# Patient Record
Sex: Male | Born: 1935 | Race: White | Hispanic: No
Health system: Midwestern US, Academic
[De-identification: ages and names within clinical notes are randomized; demographics above are authoritative.]

---

## 2016-12-11 ENCOUNTER — Ambulatory Visit: Admit: 2016-12-11 | Discharge: 2016-12-12 | Payer: MEDICARE

## 2016-12-11 ENCOUNTER — Encounter: Admit: 2016-12-11 | Discharge: 2016-12-11 | Payer: MEDICARE

## 2016-12-11 DIAGNOSIS — E7800 Pure hypercholesterolemia, unspecified: ICD-10-CM

## 2016-12-11 DIAGNOSIS — E669 Obesity, unspecified: ICD-10-CM

## 2016-12-11 DIAGNOSIS — I4821 Permanent atrial fibrillation: Principal | ICD-10-CM

## 2016-12-11 DIAGNOSIS — I2510 Atherosclerotic heart disease of native coronary artery without angina pectoris: ICD-10-CM

## 2016-12-11 DIAGNOSIS — I10 Essential (primary) hypertension: ICD-10-CM

## 2016-12-11 DIAGNOSIS — M1990 Unspecified osteoarthritis, unspecified site: ICD-10-CM

## 2016-12-11 DIAGNOSIS — M069 Rheumatoid arthritis, unspecified: ICD-10-CM

## 2016-12-11 DIAGNOSIS — E785 Hyperlipidemia, unspecified: ICD-10-CM

## 2016-12-11 NOTE — Progress Notes
Date of Service: 12/11/2016    Fernando [name] is a 82 y.o. male.       HPI     Mr. Fernando [name]???has been followed in the past for coronary artery disease,???having undergone coronary bypass surgery in 2007. ???He also has been followed for hypertension and for hypercholesterolemia. ??? On May 24, 2016 during a routine visit, a routine ECG revealed atrial fibrillation. At that time he reported no palpitations or sensation of sustained forceful heart pounding.  However,  His CHA2DS2-VASc score is 3 for age and hypertension. ???The risks and benefits of anticoagulation therapy were reviewed with the patient. Different anticoagulant medications were reviewed with the patient in detail.  He is very polite but is adamant that he will not take anticoagulant medications.  I believe that he is concerned about the bleeding risk involved. The patient???reports that he continues to feel well and has no complaints. ???Mr. Fernando [name] reports that he is active with daily chores and tries to walk several days a week for exercise. The patient reports no angina, congestive symptoms, palpitations, sensation of sustained forceful heart pounding, lightheadedness or syncope. ???His exercise tolerance has been stable. The patient reports no myalgias, bleeding abnormalities, neurologic motor abnormalities or difficulty with speech. Mr. Fernando [name]???reports no recent emergency room visits or hospitalizations.       Vitals:    12/11/16 0837 12/11/16 0845   BP: 126/78 130/70   Pulse: 62    Weight: 112 kg (247 lb)    Height: 1.88 m (6' 2)      Body mass index is 31.71 kg/m???.     Past Medical History  Patient Active Problem List    Diagnosis Date Noted   ??? Osteoarthrosis, unspecified whether generalized or localized, lower leg 03/11/2012   ??? HTN (hypertension) 03/11/2012   ??? Primary localized osteoarthrosis, lower leg 12/18/2011   ??? Pain in joint, lower leg 12/18/2011   ??? S/P knee replacement 12/18/2011   ??? Knee pain 10/25/2011   ??? Osteoarthritis of knee 10/25/2011 ??? 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 CAD (coronary artery disease) 11/11/2008     status post CABG on 06/18/2005 (patient received a LIMA to      LAD, reverse SVG to OM branch, reverse SVG to mid-RCA and reverse SVG to second      diagonal).  Patient is angina free and he is a functional Class I.  History of chest pain and admission in [facility] in 11/2007.  Exercise myocardial perfusion imaging study dated 11/13/2007, unremarkable for      ischemia.     ??? Obesity 11/11/2008   ??? Hyperlipidemia 11/11/2008   ??? Hypertension, essential 11/11/2008         Review of Systems   Constitution: Negative.   HENT: Negative.    Eyes: Negative.    Cardiovascular: Negative.    Respiratory: Negative.    Endocrine: Negative.    Hematologic/Lymphatic: Negative.    Skin: Negative.    Musculoskeletal: Negative.    Gastrointestinal: Negative.    Genitourinary: Negative.    Neurological: Negative.    Psychiatric/Behavioral: Negative.    Allergic/Immunologic: Negative.        Physical Exam  GENERAL: The patient is well developed, well nourished, resting comfortably and in no distress.   HEENT: No abnormalities of the visible oro-nasopharynx, conjunctiva or sclera are noted.  NECK: There is no jugular venous distension. Carotids are palpable and without bruits. There is no thyroid enlargement.  Chest: Lung fields are  clear to auscultation. There are no wheezes or crackles.  CV: There is a regular rhythm. The first and second heart sounds are normal. ???Grade 2/6 to 3/6 systolic ejection murmur is heard near the right upper sternal border. ???There are no diastolic murmurs, gallops or rubs.  His apical heart rate is 72 bpm.  ABD: The abdomen is soft and supple with normal bowel sounds. There is no hepatosplenomegaly, ascites, tenderness, masses or bruits.  Neuro: There are no focal motor defects. Ambulation is normal. Cognitive function appears normal.  Ext:???There is trace bipedal edema without ???evidence of deep vein thrombosis. Peripheral pulses are satisfactory. ???  SKIN:???There are no rashes and no cellulitis  PSYCH:???The patient is calm, rationale and oriented.    Cardiovascular Studies  A twelve-lead ECG was obtained on 05/24/2016 reveals atrial fibrillation with a heart rate of 76 bpm. ???Mild nonspecific ST-T wave abnormalities are noted but there is no evidence of myocardial ischemia or myocardial infarction. ???A prior ECG obtained on 03/04/2014 revealed normal sinus rhythm with a heart rate of 58 bpm.  ???  Echo Doppler study 05/30/2016:  ??? Technically difficult study; i.v. transpulmonary contrast was used to define the endocardial borders.  ??? The underlying rhythm is atrial fibrillation.  ??? Normal left ventricular systolic function, estimated ejection fraction is 60%.  ??? Severe predominantly mid septal hypertrophy.  ??? Probably severe diastolic dysfunction, elevated filling pressure.  ??? Right Ventricle: Mildly dilated. TAPSE ranged from 1.5 cm to 1.6 cm (reference range 1.5 - 2.0 cm). Mildly reduced ejection fraction.  ??? Bi atrial diltatation.  ??? Mild mitral and tricuspid valve regurgitation, moderate to severe pulmonic valve regurgitation.  ??? Severely calcified aortic valve, mild stenosis (MG= 12 mmHG, AVA= 1.10 cm???, peak velocity = 2.4 m/sec), no regurgitation.  ??? Estimated Peak Systolic PA Pressure = 43 mmHg    Regadenoson thallium stress test 06/20/2016:  Scintigraphic (planar/tomographic): There are no perfusion defects.  All myocardial segments appear viable. Polar coordinate map identifies no perfusion abnormalities.???  Regional Wall Thickening and Motion Post Stress: There is abnormal septal motion with retained thickening consistent with the patient's history of prior cardiac surgery. Otherwise all other segments demonstrate normal left ventricular wall motion and thickening of all myocardial segments. Left Ventricular Ejection Fraction (post stress, in the resting state) = 60 %. Left Ventricular End Diastolic Volume: 87 mL.???  SUMMARY/OPINION:  This study is normal with no evidence of significant myocardial ischemia. Left ventricular systolic function is normal, the ejection fraction was 60%. There are no high risk prognostic indicators present.  The pulmonary to myocardial count ratio is borderline at 0.51.  There is no transient ischemic dilatation noted.  The ECG portion of the study is negative for ischemia.???  There was a prior study obtained in our office on 03/06/2012.  The images were not able to be restored for direct comparison.  However, the report indicated the ejection fraction is 59%, the end-diastolic volume was 120 mL, the pulmonary marker to account ratio 0.40, there is no transient ischemic dilatation.  The study also demonstrated no significant perfusion abnormalities. Overall it is not appear to be a significant change since the prior study.  ???  Problems Addressed Today  Permanent atrial fibrillation.  Coronary artery disease.  Hypertension.  Hypercholesterolemia.  Assessment and Plan     Mr. Sheeley's ECG from 05/24/2016 revealed atrial fibrillation and his heart rate is still irregular today.???His heart rate is well controlled. His CHA2DS2-VASc score is 3 for age and hypertension. ???The  risks and benefits of anticoagulation therapy were once again extensively reviewed with the patient in lay terms. The patient understands that anticoagulation is used to decrease thrombotic or clotting complications associated with atrial fibrillation/flutter, such as stroke and systemic embolization which can be disabling or fatal, but can, on occasion, lead to life-threatening bleeding complications including gastrointestinal or intracranial hemorrhage. Anticoagulation options were presented to the patient which included warfarin or one of the newer direct acting oral anticoagulants.  He is very polite, however,  Mr. Herberger, once again, is adamant that he will not take an anticoagulant.  I believe he is concerned about the bleeding risk involved. Mr. Shankman maintains that he is asymptomatic with respect to his atrial fibrillation which may be the primary reason that he will not take an anticoagulant. ???I, once again, spoke with him about a strategy of heart rhythm control. ???Cardioversion, antiarrhythmic drug therapy, and cardiac arrhythmia ablation therapy were reviewed with the patient in detail and in lay terms. ???Mr. Langenbach???states that because he is asymptomatic, that???he did not want to consider cardioversion, antiarrhythmic drug therapy or cardiac arrhythmia ablation at this time and these options would require that he take anticoagulation which he has declined.[name] ???He does understand that the longer he remains in atrial fibrillation,???the more difficult it may be to convert his heart rhythm at a later time. ???I have asked him to return for follow-up in approximately 6 months to review his progress.         Current Medications (including today's revisions)  ??? amLODIPine (NORVASC) 5 mg tablet Take 1 Tab by mouth twice daily. (Patient taking differently: Take 10 mg by mouth daily.)   ??? aspirin EC 81 mg tablet Take 81 mg by mouth daily. Take with food.   ??? losartan (COZAAR) 50 mg tablet TAKE 1 TABLET EVERY DAY   ??? metoprolol XL (TOPROL XL) 50 mg extended release tablet Take 50 mg by mouth daily.   ??? nitroglycerin (NITROSTAT) 0.4 mg tablet Place 1 Tab under tongue every 5 minutes as needed for Chest Pain.   ??? simvastatin (ZOCOR) 20 mg tablet TAKE ONE TABLET BY MOUTH AT BEDTIME

## 2017-07-25 LAB — LIPID PROFILE: Lab: 3

## 2017-07-30 ENCOUNTER — Encounter: Admit: 2017-07-30 | Discharge: 2017-07-30 | Payer: MEDICARE

## 2017-07-30 ENCOUNTER — Ambulatory Visit: Admit: 2017-07-30 | Discharge: 2017-07-31 | Payer: MEDICARE

## 2017-07-30 DIAGNOSIS — I2510 Atherosclerotic heart disease of native coronary artery without angina pectoris: Principal | ICD-10-CM

## 2017-07-30 DIAGNOSIS — M1990 Unspecified osteoarthritis, unspecified site: ICD-10-CM

## 2017-07-30 DIAGNOSIS — I350 Nonrheumatic aortic (valve) stenosis: ICD-10-CM

## 2017-07-30 DIAGNOSIS — E669 Obesity, unspecified: ICD-10-CM

## 2017-07-30 DIAGNOSIS — M069 Rheumatoid arthritis, unspecified: ICD-10-CM

## 2017-07-30 DIAGNOSIS — I10 Essential (primary) hypertension: ICD-10-CM

## 2017-07-30 DIAGNOSIS — E785 Hyperlipidemia, unspecified: ICD-10-CM

## 2017-08-08 ENCOUNTER — Encounter: Admit: 2017-08-08 | Discharge: 2017-08-08 | Payer: MEDICARE

## 2017-08-09 MED ORDER — LOSARTAN 50 MG PO TAB
ORAL_TABLET | Freq: Every day | ORAL | 3 refills | 30.00000 days | Status: AC
Start: 2017-08-09 — End: ?

## 2017-10-11 ENCOUNTER — Encounter: Admit: 2017-10-11 | Discharge: 2017-10-11 | Payer: MEDICARE

## 2017-10-15 ENCOUNTER — Encounter: Admit: 2017-10-15 | Discharge: 2017-10-15 | Payer: MEDICARE

## 2017-10-15 ENCOUNTER — Ambulatory Visit: Admit: 2017-10-15 | Discharge: 2017-10-16 | Payer: MEDICARE

## 2017-10-15 DIAGNOSIS — I10 Essential (primary) hypertension: Principal | ICD-10-CM

## 2017-10-15 DIAGNOSIS — E785 Hyperlipidemia, unspecified: ICD-10-CM

## 2017-10-15 DIAGNOSIS — I4891 Unspecified atrial fibrillation: ICD-10-CM

## 2017-10-15 DIAGNOSIS — M1990 Unspecified osteoarthritis, unspecified site: ICD-10-CM

## 2017-10-15 DIAGNOSIS — I2510 Atherosclerotic heart disease of native coronary artery without angina pectoris: Principal | ICD-10-CM

## 2017-10-15 DIAGNOSIS — E669 Obesity, unspecified: ICD-10-CM

## 2017-10-15 DIAGNOSIS — M069 Rheumatoid arthritis, unspecified: ICD-10-CM

## 2017-10-15 LAB — BASIC METABOLIC PANEL
Lab: 1.3 — ABNORMAL HIGH (ref 0.72–1.25)
Lab: 104
Lab: 136
Lab: 139 — ABNORMAL HIGH (ref 83–110)
Lab: 15 — ABNORMAL HIGH (ref 0–14)
Lab: 22 — ABNORMAL LOW (ref 23–31)
Lab: 26 — ABNORMAL HIGH (ref 8.4–25.7)
Lab: 4.7
Lab: 9.5

## 2017-10-15 MED ORDER — APIXABAN 5 MG PO TAB
5 mg | ORAL_TABLET | Freq: Two times a day (BID) | ORAL | 11 refills | Status: AC
Start: 2017-10-15 — End: 2018-02-25

## 2017-10-16 ENCOUNTER — Encounter: Admit: 2017-10-16 | Discharge: 2017-10-16 | Payer: MEDICARE

## 2017-10-16 DIAGNOSIS — I10 Essential (primary) hypertension: Principal | ICD-10-CM

## 2017-10-16 DIAGNOSIS — I4891 Unspecified atrial fibrillation: ICD-10-CM

## 2017-10-28 ENCOUNTER — Ambulatory Visit: Admit: 2017-10-28 | Discharge: 2017-10-29 | Payer: MEDICARE

## 2017-10-28 DIAGNOSIS — I350 Nonrheumatic aortic (valve) stenosis: ICD-10-CM

## 2017-10-28 DIAGNOSIS — I2510 Atherosclerotic heart disease of native coronary artery without angina pectoris: Principal | ICD-10-CM

## 2017-10-28 MED ORDER — PERFLUTREN LIPID MICROSPHERES 1.1 MG/ML IV SUSP
1-20 mL | Freq: Once | INTRAVENOUS | 0 refills | Status: CP | PRN
Start: 2017-10-28 — End: ?

## 2017-10-29 ENCOUNTER — Encounter: Admit: 2017-10-29 | Discharge: 2017-10-29 | Payer: MEDICARE

## 2017-11-11 LAB — BASIC METABOLIC PANEL
Lab: 1.2 g/dL (ref 12.0–17.0)
Lab: 104
Lab: 139
Lab: 4.6

## 2017-11-13 ENCOUNTER — Encounter: Admit: 2017-11-13 | Discharge: 2017-11-13 | Payer: MEDICARE

## 2017-11-13 DIAGNOSIS — I10 Essential (primary) hypertension: ICD-10-CM

## 2018-02-25 ENCOUNTER — Encounter: Admit: 2018-02-25 | Discharge: 2018-02-25 | Payer: MEDICARE

## 2018-02-25 ENCOUNTER — Ambulatory Visit: Admit: 2018-02-25 | Discharge: 2018-02-26 | Payer: MEDICARE

## 2018-02-25 DIAGNOSIS — M069 Rheumatoid arthritis, unspecified: ICD-10-CM

## 2018-02-25 DIAGNOSIS — E785 Hyperlipidemia, unspecified: ICD-10-CM

## 2018-02-25 DIAGNOSIS — E669 Obesity, unspecified: ICD-10-CM

## 2018-02-25 DIAGNOSIS — M1990 Unspecified osteoarthritis, unspecified site: ICD-10-CM

## 2018-02-25 DIAGNOSIS — I2510 Atherosclerotic heart disease of native coronary artery without angina pectoris: Principal | ICD-10-CM

## 2018-02-25 DIAGNOSIS — I4821 Permanent atrial fibrillation: Principal | ICD-10-CM

## 2018-02-25 DIAGNOSIS — I10 Essential (primary) hypertension: ICD-10-CM

## 2018-11-05 ENCOUNTER — Encounter: Admit: 2018-11-05 | Discharge: 2018-11-05 | Payer: MEDICARE

## 2018-11-06 MED ORDER — METOPROLOL SUCCINATE 50 MG PO TB24
ORAL_TABLET | Freq: Every day | ORAL | 1 refills | 90.00000 days | Status: AC
Start: 2018-11-06 — End: ?

## 2018-11-19 ENCOUNTER — Encounter: Admit: 2018-11-19 | Discharge: 2018-11-19

## 2018-11-19 NOTE — Telephone Encounter
Received call from pt. daughter concerning Pt. leg. Daughter states that her dad went to the Dermatologist yesterday for a swollen leg that was weeping and blistering. The dermatologist prescribed pt. some cream and advised pt. to keep leg elevated. Daughter is concerned this is a cardiac issue however upon further assessment it does not sound like a DVT. I asked the daughter if the Pt. has seen a PCP. Daughter states they would like to know if Dr. Jacklyn Shell had any recomendations [place] a [place]. I advised daughter I would ask if Dr. Jacklyn Shell knew anyone.

## 2018-12-23 ENCOUNTER — Encounter: Admit: 2018-12-23 | Discharge: 2018-12-23

## 2018-12-24 ENCOUNTER — Encounter: Admit: 2018-12-24 | Discharge: 2018-12-24

## 2018-12-24 MED ORDER — LOSARTAN 100 MG PO TAB
ORAL_TABLET | Freq: Every day | 0 refills
Start: 2018-12-24 — End: ?

## 2018-12-24 NOTE — Telephone Encounter
LOV   LMF

## 2018-12-30 ENCOUNTER — Encounter: Admit: 2018-12-30 | Discharge: 2018-12-30

## 2018-12-30 ENCOUNTER — Ambulatory Visit: Admit: 2018-12-30 | Discharge: 2018-12-31

## 2018-12-30 DIAGNOSIS — I10 Essential (primary) hypertension: Secondary | ICD-10-CM

## 2018-12-30 DIAGNOSIS — M1990 Unspecified osteoarthritis, unspecified site: Secondary | ICD-10-CM

## 2018-12-30 DIAGNOSIS — I4821 Permanent atrial fibrillation: Secondary | ICD-10-CM

## 2018-12-30 DIAGNOSIS — E785 Hyperlipidemia, unspecified: Secondary | ICD-10-CM

## 2018-12-30 DIAGNOSIS — M069 Rheumatoid arthritis, unspecified: Secondary | ICD-10-CM

## 2018-12-30 DIAGNOSIS — E669 Obesity, unspecified: Secondary | ICD-10-CM

## 2018-12-30 DIAGNOSIS — I2510 Atherosclerotic heart disease of native coronary artery without angina pectoris: Secondary | ICD-10-CM

## 2018-12-30 NOTE — Progress Notes
Date of Service: 12/30/2018    Fernando [name] is a 84 y.o. male.       HPI     Fernando [name]???has been followed in the past for coronary artery disease,???having undergone coronary bypass surgery in 2007. ???He also has been followed for hypertension, permanent atrial fibrillation and for hypercholesterolemia. ??? On May 24, 2016???during a???routine visit,???a routine ECG revealed atrial fibrillation.???At that time he reported no palpitations or sensation of sustained forceful heart pounding. ???However, ???His CHA2DS2-VASc score is 4???for age,???diabetes???and hypertension. ???  He had always declined anticoagulation for stroke prevention.  When I saw him in May 2019 he indicated that he was willing to consider starting apixaban.  The medication was prescribed but the patient never took the medication, and now once again he is very polite but adamantly declines any form of anticoagulation even though he knows the stroke risk associated with atrial fibrillation.  He understands that stroke can be disabling or fatal..  The patient???reports that he continues to feel well and has no complaints???other than some mild gradual fatigue which has developed in recent years. ???Fernando [name] reports that he is still???active with daily chores and tries to walk several days a week???for exercise.  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    He also has his own apple orchard.  He indicates that it costs more to keep the apple orchard in shape than it does to buy apples from the grocery store.  The patient reports no angina, congestive symptoms, palpitations, sensation of sustained forceful heart pounding, lightheadedness or syncope. The patient reports no myalgias, bleeding abnormalities, neurologic motor abnormalities or difficulty with speech. Fernando [name]???reports no recent emergency room visits or hospitalizations.  He reports no febrile symptoms or recent infections.       Vitals:    12/30/18 1107   BP: 122/72   BP Source: Arm, Right Upper Pulse: 91   Temp: 37 ???C (98.6 ???F)   SpO2: 98%   Weight: 116.3 kg (256 lb 6.4 oz)   Height: 1.854 m (6' 1)   PainSc: Zero     Body mass index is 33.83 kg/m???.     Past Medical History  Patient Active Problem List    Diagnosis Date Noted   ??? Osteoarthrosis, unspecified whether generalized or localized, lower leg 03/11/2012   ??? HTN (hypertension) 03/11/2012   ??? Primary localized osteoarthrosis, lower leg 12/18/2011   ??? Pain in joint, lower leg 12/18/2011   ??? S/P knee replacement 12/18/2011   ??? Knee pain 10/25/2011   ??? Osteoarthritis of knee 10/25/2011   ??? 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 CAD (coronary artery disease) 11/11/2008     status post CABG on 06/18/2005 (patient received a LIMA to      LAD, reverse SVG to OM branch, reverse SVG to mid-RCA and reverse SVG to second      diagonal).  Patient is angina free and he is a functional Class I.  History of chest pain and admission in [facility] in 11/2007.  Exercise myocardial perfusion imaging study dated 11/13/2007, unremarkable for      ischemia.     ??? Obesity 11/11/2008   ??? Hyperlipidemia 11/11/2008   ??? Hypertension, essential 11/11/2008         Review of Systems   Constitution: Negative.   HENT: Negative.    Eyes: Negative.    Cardiovascular: Negative.    Respiratory: Negative.    Endocrine: Negative.    Hematologic/Lymphatic:  Negative.    Skin: Negative.    Musculoskeletal: Negative.    Gastrointestinal: Negative.    Genitourinary: Negative.    Neurological: Negative.    Psychiatric/Behavioral: Negative.    Allergic/Immunologic: Negative.        Physical Exam  GENERAL: The patient is well developed, well nourished, resting comfortably and in no distress.   HEENT: No abnormalities of the visible oro-nasopharynx, conjunctiva or sclera are noted.  NECK: There is no jugular venous distension. Carotids are palpable and without bruits. There is no thyroid enlargement.  Chest: Lung fields are clear to auscultation. There are no wheezes or crackles.  CV: There is an irregular rhythm???with variable intensity of the first heart sound.??????A???Grade 3/6 systolic ejection murmur is heard near the right???upper sternal border. ???There are no diastolic murmurs, gallops or rubs. ???His apical heart rate is???76???bpm.  ABD: The abdomen is soft and supple with normal bowel sounds. There is no hepatosplenomegaly, ascites, tenderness, masses or bruits.  Neuro: There are no focal motor defects. Ambulation is normal. Cognitive function appears normal.  Ext:???There is 1+ bipedal edema without ???evidence of deep vein thrombosis. Peripheral pulses are satisfactory. ???  SKIN:???There are no rashes and no cellulitis  PSYCH:???The patient is calm, rationale and oriented.    Cardiovascular Studies    A twelve-lead ECG obtained on 12/30/2018 reveals atrial fibrillation with a heart rate of 80 bpm.  Incomplete right bundle branch block is noted with a QRS duration of 110 ms.  Nondiagnostic ST-T wave abnormalities are seen.    Labs from 07/31/2018 reveals total cholesterol 142, triglycerides 49, HDL 46 and LDL cholesterol 93 mg/dL.  His ALT = 23.  His potassium was 4.5 mmol/L and serum creatinine was 1.22 mg/dL.    Echo Doppler study 05/30/2016:  ??? Technically difficult study; i.v. transpulmonary contrast was used to define the endocardial borders.  ??? The underlying rhythm is atrial fibrillation.  ??? Normal left ventricular systolic function, estimated ejection fraction is 60%.  ??? Severe predominantly mid septal hypertrophy.  ??? Probably severe diastolic dysfunction, elevated filling pressure.  ??? Right Ventricle: Mildly dilated. TAPSE ranged from 1.5 cm to 1.6 cm (reference range 1.5 - 2.0 cm). Mildly reduced ejection fraction.  ??? Bi atrial diltatation.  ??? Mild mitral and tricuspid valve regurgitation, moderate to severe pulmonic valve regurgitation.  ??? Severely calcified aortic valve, mild stenosis (MG= 12 mmHG, AVA= 1.10 cm???, peak velocity = 2.4 m/sec), no regurgitation. ??? Estimated Peak Systolic PA Pressure = 43 mmHg  ???  Regadenoson thallium stress test 06/20/2016:  Scintigraphic (planar/tomographic): There are no perfusion defects. ???All myocardial segments appear viable. Polar coordinate map identifies no perfusion abnormalities.???  Regional Wall Thickening and Motion Post Stress: There is abnormal septal motion with retained thickening consistent with the patient's history of prior cardiac surgery. Otherwise all other segments demonstrate normal left ventricular wall motion and thickening of all myocardial segments. Left Ventricular Ejection Fraction (post stress, in the resting state) = 60???%.???Left Ventricular End Diastolic Volume: 40???NU.???  SUMMARY/OPINION: ???This study is normal with no evidence of significant myocardial ischemia. Left ventricular systolic function is normal, the ejection fraction was 60%. There are no high risk prognostic indicators present. ???The pulmonary to myocardial count ratio is borderline at 0.51. ???There is no transient ischemic dilatation noted. ???The ECG portion of the study is negative for ischemia.???  There was a prior study obtained in our office on 03/06/2012. ???The images were not able to be restored for direct comparison. ???However, the report indicated the  ejection fraction is 59%, the end-diastolic volume was 120 mL, the pulmonary marker to account ratio 0.40, there is no transient ischemic dilatation. ???The study also demonstrated no significant perfusion abnormalities. Overall it is not appear to be a significant change since the prior study.    Problems Addressed Today  Permanent atrial fibrillation.  Hypertension.  Hypercholesterolemia.  Assessment and Plan     Mr. Coman has accepted???atrial fibrillation as his permanent cardiac rhythm and reports no symptoms related to this arrhythmia. His heart rate is well controlled. His CHA2DS2-VASc score is 4 for age, diabetes and hypertension. ???The risks and benefits of anticoagulation therapy were once again extensively reviewed with the patient???in lay terms. The patient understands that anticoagulation is used to decrease thrombotic or clotting complications associated with atrial fibrillation/flutter, such as stroke and systemic embolization which can be disabling or fatal, but can, on occasion, lead to life-threatening bleeding complications including gastrointestinal or intracranial hemorrhage. Anticoagulation options were presented to the patient which included warfarin or one of the newer direct acting oral anticoagulants. ???I tried very emphatically to convince him to start anticoagulation without success.  He is very polite, however, ???Mr. Mascia, once again,???is adamant that he will not take an anticoagulant. ???I believe he is concerned about the bleeding risk involved. Mr. Sedgwick maintains that he is asymptomatic with respect to his atrial fibrillation???which may be the primary???reason that he will not take an anticoagulant. ???I, once again, spoke with him about a strategy of heart rhythm control.??????Cardioversion, antiarrhythmic drug therapy, and cardiac arrhythmia ablation therapy were reviewed with the patient in detail and in lay terms. ???Mr. Rhodd???states that because he is???asymptomatic, that???he did not want to consider cardioversion, antiarrhythmic drug therapy or cardiac arrhythmia ablation at this time and???these options would require that he take anticoagulation which he has declined.[name] ???He does understand that the longer he remains in atrial fibrillation,???the more difficult it may be to convert his heart rhythm at a later time.  He has mild pedal edema and in the future he may be agreeable to reducing his amlodipine and starting a diuretic such as hydrochlorothiazide.  However, he does not want to change his antihypertensive regimen at the present time. ???I have asked him to return for follow-up in approximately 6???months???to review his progress.         Current Medications (including today's revisions) ??? amLODIPine (NORVASC) 5 mg tablet Take 1 Tab by mouth twice daily. (Patient taking differently: Take 10 mg by mouth daily.)   ??? aspirin EC 81 mg tablet Take 81 mg by mouth daily. Take with food.   ??? losartan (COZAAR) 50 mg tablet TAKE 1 TABLET EVERY DAY   ??? metoprolol XL (TOPROL XL) 50 mg extended release tablet TAKE 1 TABLET EVERY DAY   ??? nitroglycerin (NITROSTAT) 0.4 mg tablet Place 1 Tab under tongue every 5 minutes as needed for Chest Pain.   ??? pioglitazone (ACTOS) 15 mg tablet Take 1 tablet by mouth daily.   ??? simvastatin (ZOCOR) 20 mg tablet TAKE ONE TABLET BY MOUTH AT BEDTIME

## 2019-05-06 ENCOUNTER — Encounter: Admit: 2019-05-06 | Discharge: 2019-05-06 | Payer: MEDICARE

## 2019-05-06 MED ORDER — METOPROLOL SUCCINATE 50 MG PO TB24
ORAL_TABLET | Freq: Every day | 1 refills
Start: 2019-05-06 — End: ?

## 2019-05-06 NOTE — Telephone Encounter
LOV None found here

## 2019-09-10 ENCOUNTER — Encounter: Admit: 2019-09-10 | Discharge: 2019-09-10 | Payer: MEDICARE

## 2019-09-10 DIAGNOSIS — I4821 Permanent atrial fibrillation: Secondary | ICD-10-CM

## 2019-09-10 DIAGNOSIS — E669 Obesity, unspecified: Secondary | ICD-10-CM

## 2019-09-10 DIAGNOSIS — I2510 Atherosclerotic heart disease of native coronary artery without angina pectoris: Secondary | ICD-10-CM

## 2019-09-10 DIAGNOSIS — I350 Nonrheumatic aortic (valve) stenosis: Secondary | ICD-10-CM

## 2019-09-10 DIAGNOSIS — E785 Hyperlipidemia, unspecified: Secondary | ICD-10-CM

## 2019-09-10 DIAGNOSIS — I10 Essential (primary) hypertension: Secondary | ICD-10-CM

## 2019-09-10 DIAGNOSIS — M1990 Unspecified osteoarthritis, unspecified site: Secondary | ICD-10-CM

## 2019-09-10 DIAGNOSIS — M069 Rheumatoid arthritis, unspecified: Secondary | ICD-10-CM

## 2019-09-10 NOTE — Progress Notes
Date of Service: 09/10/2019    Fernando Fernando [name] is a 85 y.o. male.       HPI     Fernando Fernando [name] been followed in the past for coronary artery disease,?having undergone coronary bypass surgery in 2007.  He reports no febrile or infectious symptoms during the Covid pandemic he has received Fernando [name] of his Pfizer Covid vaccines. ?He also has been followed for hypertension, permanent atrial fibrillation and for hypercholesterolemia. ? On May 24, 2016?during a?routine visit,?a routine ECG revealed atrial fibrillation.?At that time he reported no palpitations or sensation of sustained forceful heart pounding. ?However, ?His CHA2DS2-VASc score is 4?for age,?diabetes?and hypertension.????He had always declined anticoagulation for stroke prevention. ?When I saw him in May 2019 he indicated that he was willing to consider starting apixaban. ?The medication was prescribed but the patient never took the medication, and now once again, he is very polite but adamantly declines any form of anticoagulation even though he knows the stroke risk associated with atrial fibrillation.  He understands that stroke can be disabling or fatal. ?The patient?reports that he continues to feel well and has no complaints?other than having gained 10 pounds during the Covid pandemic due to inactivity.  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  He also has his own apple orchard.  He indicates that it costs more to keep the apple orchard in shape than it does to buy apples from the grocery store.  The patient reports no angina, congestive symptoms, palpitations, sensation of sustained forceful heart pounding, lightheadedness or syncope. The patient reports no myalgias, bleeding abnormalities, neurologic motor abnormalities or difficulty with speech. Fernando Fernando [name]?reports no recent emergency room visits or hospitalizations. Fernando Fernando [name] indicates that his blood pressure is always well controlled when he checks it outside of the office.        Vitals:    09/10/19 1043 09/10/19 1055   BP: 136/80 138/80   BP Source: Arm, Right Upper Arm, Left Upper   Patient Position: Sitting Sitting   Pulse: 84    SpO2: 98%    Weight: 122 kg (269 lb)    Height: 1.854 m (6' 1)    PainSc: Zero      Body mass index is 35.49 kg/m?[name]     Past Medical History  Patient Active Problem List    Diagnosis Date Noted   ? Osteoarthrosis, unspecified whether generalized or localized, lower leg 03/11/2012   ? HTN (hypertension) 03/11/2012   ? Primary localized osteoarthrosis, lower leg 12/18/2011   ? Pain in joint, lower leg 12/18/2011   ? S/P knee replacement 12/18/2011   ? Knee pain 10/25/2011   ? Osteoarthritis of knee 10/25/2011   ? 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 CAD (coronary artery disease) 11/11/2008     status post CABG on 06/18/2005 (patient received a LIMA to      LAD, reverse SVG to OM branch, reverse SVG to mid-RCA and reverse SVG to second      diagonal).  Patient is angina free and he is a functional Class I.  History of chest pain and admission in [facility] in 11/2007.  Exercise myocardial perfusion imaging study dated 11/13/2007, unremarkable for      ischemia.     ? Obesity 11/11/2008   ? Hyperlipidemia 11/11/2008   ? Hypertension, essential 11/11/2008         Review of Systems   Constitution: Negative.   HENT: Negative.    Eyes: Negative.  Cardiovascular: Negative.    Respiratory: Negative.    Endocrine: Negative.    Hematologic/Lymphatic: Negative.    Skin: Negative.    Musculoskeletal: Negative.    Gastrointestinal: Negative.    Genitourinary: Negative.    Neurological: Negative.    Psychiatric/Behavioral: Negative.    Allergic/Immunologic: Negative.        Physical Exam  GENERAL: The patient is well developed, well nourished, resting comfortably and in no distress.   HEENT: No abnormalities of the visible oro-nasopharynx, conjunctiva or sclera are noted.  NECK: There is no jugular venous distension. Carotids are palpable and without bruits. There is no thyroid enlargement.  Chest: Lung fields are clear to auscultation. There are no wheezes or crackles.  CV: There is an irregular rhythm?with variable intensity of the first heart sound.??A?Grade 3/6 systolic ejection murmur is heard near the right?upper sternal border. ?There are no diastolic murmurs, gallops or rubs. ?His apical heart rate is?72?bpm.  ABD: The abdomen is soft and supple with normal bowel sounds. There is no hepatosplenomegaly, ascites, tenderness, masses or bruits.  Neuro: There are no focal motor defects. Ambulation is normal. Cognitive function appears normal.  Ext:?There is 1-2+?bipedal edema without ?evidence of deep vein thrombosis.  He indicates that his edema is well controlled when he wears his support stockings but for some reason he only wears a support stockings every other day.  Peripheral pulses are satisfactory. ?  SKIN:?There are no rashes and no cellulitis  PSYCH:?The patient is calm, rationale and oriented.    Cardiovascular Studies  A twelve-lead ECG obtained on 12/30/2018 reveals atrial fibrillation with a heart rate of 80 bpm.  Incomplete right bundle branch block is noted with a QRS duration of 110 ms.  Nondiagnostic ST-T wave abnormalities are seen.  ?  Labs from 07/31/2018 reveals total cholesterol 142, triglycerides 49, HDL 46 and LDL cholesterol 93 mg/dL.  His ALT = 23.  His potassium was 4.5 mmol/L and serum creatinine was 1.22 mg/dL.  ?  Echo Doppler study 05/30/2016:  ? Technically difficult study; i.v. transpulmonary contrast was used to define the endocardial borders.  ? The underlying rhythm is atrial fibrillation.  ? Normal left ventricular systolic function, estimated ejection fraction is 60%.  ? Severe predominantly mid septal hypertrophy.  ? Probably severe diastolic dysfunction, elevated filling pressure.  ? Right Ventricle: Mildly dilated. TAPSE ranged from 1.5 cm to 1.6 cm (reference range 1.5 - 2.0 cm). Mildly reduced ejection fraction.  ? Bi atrial diltatation.  ? Mild mitral and tricuspid valve regurgitation, moderate to severe pulmonic valve regurgitation.  ? Severely calcified aortic valve, mild stenosis (MG= 12 mmHG, AVA= 1.10 cm?, peak velocity = 2.4 m/sec), no regurgitation.  ? Estimated Peak Systolic PA Pressure = 43 mmHg  ?  Regadenoson thallium stress test 06/20/2016:  Scintigraphic (planar/tomographic): There are no perfusion defects. ?All myocardial segments appear viable. Polar coordinate map identifies no perfusion abnormalities.?  Regional Wall Thickening and Motion Post Stress: There is abnormal septal motion with retained thickening consistent with the patient's history of prior cardiac surgery. Otherwise all other segments demonstrate normal left ventricular wall motion and thickening of all myocardial segments. Left Ventricular Ejection Fraction (post stress, in the resting state) = 60?%.?Left Ventricular End Diastolic Volume: 16?XW.?  SUMMARY/OPINION: ?This study is normal with no evidence of significant myocardial ischemia. Left ventricular systolic function is normal, the ejection fraction was 60%. There are no high risk prognostic indicators present. ?The pulmonary to myocardial count ratio is borderline at 0.51. ?There is  no transient ischemic dilatation noted. ?The ECG portion of the study is negative for ischemia.?  There was a prior study obtained in our office on 03/06/2012. ?The images were not able to be restored for direct comparison. ?However, the report indicated the ejection fraction is 59%, the end-diastolic volume was 120 mL, the pulmonary marker to account ratio 0.40, there is no transient ischemic dilatation. ?The study also demonstrated no significant perfusion abnormalities. Overall it is not appear to be a significant change since the prior study.    Problems Addressed Today  Permanent atrial fibrillation.  Aortic stenosis.  Hypertension.  Hypercholesterolemia.  Assessment and Plan     Mr. Billick has accepted?atrial fibrillation as his permanent cardiac rhythm and reports no symptoms related to this arrhythmia. His heart rate is well controlled. His CHA2DS2-VASc score is?4?for age, diabetes?and hypertension. ?The risks and benefits of anticoagulation therapy were once again extensively reviewed with the patient?in lay terms. The patient understands that anticoagulation is used to decrease thrombotic or clotting complications associated with atrial fibrillation/flutter, such as stroke and systemic embolization which can be disabling or fatal, but can, on occasion, lead to life-threatening bleeding complications including gastrointestinal or intracranial hemorrhage. Anticoagulation options were presented to the patient which included warfarin or one of the newer direct acting oral anticoagulants. ?I have  tried very emphatically to convince him to start anticoagulation without success.  He is very polite, however, ?Mr. Hellmann, once again,?is adamant that he will not take an anticoagulant. ?I believe he is concerned about the bleeding risk involved. Mr. Bittenbender maintains that he is asymptomatic with respect to his atrial fibrillation?which may be the primary?reason that he will not take an anticoagulant. ?I, once again, spoke with him about a strategy of heart rhythm control.??Cardioversion, antiarrhythmic drug therapy, and cardiac arrhythmia ablation therapy were reviewed with the patient in detail and in lay terms. ?Mr. Retzloff?states that because he is?asymptomatic, that?he did not want to consider cardioversion, antiarrhythmic drug therapy or cardiac arrhythmia ablation at this time and?these options would require that he take anticoagulation which he has declined.[name] ?He does understand that the longer he remains in atrial fibrillation,?the more difficult it may be to convert his heart rhythm at a later time. ?He has pedal edema and in the future he may be agreeable to reducing his amlodipine and starting a diuretic such as hydrochlorothiazide. ?However, he does not want to change his antihypertensive regimen at the present time.  I have encouraged him to wear support stockings.  His blood pressure was mildly elevated today but he reports that is usually well controlled outside the office. Mr. Maddaloni indicates that his granddaughter is a Engineer, civil (consulting) and he will ask her to monitor his blood pressure. I have asked the patient to keep a log book of his BP readings and to report BP readings exceeding 130/80 mm Hg.  ?I have asked him to return for follow-up in approximately 6?months?to review his progress.         Current Medications (including today's revisions)  ? amLODIPine (NORVASC) 5 mg tablet Take 1 Tab by mouth twice daily. (Patient taking differently: Take 10 mg by mouth daily.)   ? aspirin EC 81 mg tablet Take 81 mg by mouth daily. Take with food.   ? losartan (COZAAR) 50 mg tablet TAKE 1 TABLET EVERY DAY   ? metoprolol XL (TOPROL XL) 50 mg extended release tablet TAKE 1 TABLET EVERY DAY   ? nitroglycerin (NITROSTAT) 0.4 mg tablet Place 1 Tab under tongue every  5 minutes as needed for Chest Pain.   ? pioglitazone (ACTOS) 15 mg tablet Take 1 tablet by mouth daily.   ? simvastatin (ZOCOR) 20 mg tablet TAKE ONE TABLET BY MOUTH AT BEDTIME

## 2020-04-19 ENCOUNTER — Encounter: Admit: 2020-04-19 | Discharge: 2020-04-19 | Payer: MEDICARE

## 2020-04-19 NOTE — Telephone Encounter
Patient called with request to be scheduled for cardioversion. Patient has hx of a fib and at past office visits options of anticoagulation and cardioversion were discussed between him and Dr. Arna Medici but declined. Patient is now ready to have the cardioversion.     Will route to Dr. Arna Medici.

## 2020-04-21 ENCOUNTER — Encounter: Admit: 2020-04-21 | Discharge: 2020-04-21 | Payer: MEDICARE

## 2020-04-21 DIAGNOSIS — I4821 Permanent atrial fibrillation: Secondary | ICD-10-CM

## 2020-04-25 ENCOUNTER — Encounter: Admit: 2020-04-25 | Discharge: 2020-04-25 | Payer: MEDICARE

## 2020-04-25 DIAGNOSIS — I10 Essential (primary) hypertension: Secondary | ICD-10-CM

## 2020-04-25 NOTE — Telephone Encounter
Patient is scheduled for a cardioversion on 06/08/20. Patient was to start on anticoagulation with eliquis. Prescription was sent to [facility] and a free month coupon provided to patient's daughter, Fernando [name]. Fernando [name] called to report he is unable to use the coupon because he has used one in the past and the eliquis will cost over $500 due to insurance won't approve it. She was told they will cover xarelto or warfarin.     Will route to Dr. Arna Medici for his review and recommendations.

## 2020-05-31 ENCOUNTER — Encounter: Admit: 2020-05-31 | Discharge: 2020-05-31 | Payer: MEDICARE

## 2020-05-31 DIAGNOSIS — I10 Essential (primary) hypertension: Secondary | ICD-10-CM

## 2020-05-31 DIAGNOSIS — I2510 Atherosclerotic heart disease of native coronary artery without angina pectoris: Secondary | ICD-10-CM

## 2020-05-31 DIAGNOSIS — E785 Hyperlipidemia, unspecified: Secondary | ICD-10-CM

## 2020-05-31 DIAGNOSIS — E669 Obesity, unspecified: Secondary | ICD-10-CM

## 2020-05-31 DIAGNOSIS — M069 Rheumatoid arthritis, unspecified: Secondary | ICD-10-CM

## 2020-05-31 DIAGNOSIS — M1990 Unspecified osteoarthritis, unspecified site: Secondary | ICD-10-CM

## 2020-05-31 MED ORDER — TRIAMTERENE-HYDROCHLOROTHIAZID 37.5-25 MG PO TAB
.5 | ORAL_TABLET | Freq: Every day | ORAL | 1 refills | Status: AC
Start: 2020-05-31 — End: ?

## 2020-05-31 MED ORDER — AMLODIPINE 5 MG PO TAB
5 mg | ORAL_TABLET | Freq: Every evening | ORAL | 3 refills | Status: AC
Start: 2020-05-31 — End: ?

## 2020-05-31 NOTE — Progress Notes
Date of Service: 05/31/2020    Fernando [name] is a 85 y.o. male.       HPI     Fernando [name] been followed in the past for coronary artery disease,?having undergone coronary bypass surgery in 2007.  He reports no febrile or infectious symptoms during the Covid pandemic he has received both of his Pfizer Covid vaccines + booster. ?He also has been followed for hypertension,?permanent atrial fibrillation?and for hypercholesterolemia. ? On May 24, 2016?during a?routine visit,?a routine ECG revealed atrial fibrillation.?At that time he reported no palpitations or sensation of sustained forceful heart pounding. ?However, ?His CHA2DS2-VASc score is 4?for age,?diabetes?and hypertension.??In November 2021 he finally decided to start taking apixaban which he has been tolerating without adverse effects, other than some superficial bruising.  He has developed some increasing pedal edema over the past 6 months.  Otherwise,?the patient?reports that he continues to feel well and has no complaints.  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 The patient reports no myalgias, bleeding abnormalities, neurologic motor abnormalities or difficulty with speech. Fernando [name]?reports no recent emergency room visits or hospitalizations. Fernando [name] indicates that his blood pressure is always well controlled when he checks it outside of the office.       Vitals:    05/31/20 0932   BP: 136/78   BP Source: Arm, Left Upper   Patient Position: Sitting   Pulse: 87   SpO2: 98%   Weight: 116.7 kg (257 lb 3.2 oz)   Height: 1.854 m (6' 1)   PainSc: Zero     Body mass index is 33.93 kg/m?[name]     Past Medical History  Patient Active Problem List    Diagnosis Date Noted   ? Osteoarthrosis, unspecified whether generalized or localized, lower leg 03/11/2012   ? HTN (hypertension) 03/11/2012   ? Primary localized osteoarthrosis, lower leg 12/18/2011   ? Pain in joint, lower leg 12/18/2011   ? S/P knee replacement 12/18/2011   ? Knee pain 10/25/2011   ? Osteoarthritis of knee 10/25/2011   ? 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 CAD (coronary artery disease) 11/11/2008     status post CABG on 06/18/2005 (patient received a LIMA to      LAD, reverse SVG to OM branch, reverse SVG to mid-RCA and reverse SVG to second      diagonal).  Patient is angina free and he is a functional Class I.  History of chest pain and admission in [facility] in 11/2007.  Exercise myocardial perfusion imaging study dated 11/13/2007, unremarkable for      ischemia.     ? Obesity 11/11/2008   ? Hyperlipidemia 11/11/2008   ? Hypertension, essential 11/11/2008         Review of Systems   Constitutional: Negative.   HENT: Negative.    Eyes: Negative.    Cardiovascular: Positive for leg swelling.   Respiratory: Negative.    Endocrine: Negative.    Hematologic/Lymphatic: Negative.    Skin: Positive for dry skin and itching.   Musculoskeletal: Positive for arthritis and joint pain.   Gastrointestinal: Negative.    Genitourinary: Negative.    Neurological: Negative.    Psychiatric/Behavioral: Negative.    Allergic/Immunologic: Negative.        Physical Exam  GENERAL:  The patient is well developed, well nourished, resting comfortably and in no distress.   HEENT: No abnormalities of the visible oro-nasopharynx, conjunctiva or sclera are noted.  NECK: There is no jugular venous distension. Carotids are palpable and without bruits. There is no thyroid enlargement.  Chest: Lung fields are clear to auscultation. There are no wheezes or crackles.  CV: There is an irregular rhythm?with variable intensity of the first heart sound.??A?Grade 3/6 systolic ejection murmur is heard near the right?upper sternal border. ?There are no diastolic murmurs, gallops or rubs. ?His apical heart rate is?72?bpm.  ABD: The abdomen is soft and supple with normal bowel sounds. There is no hepatosplenomegaly, ascites, tenderness, masses or bruits.  Neuro: There are no focal motor defects. Ambulation is normal. Cognitive function appears normal.  Ext:?There 3+?bipedal edema without ?evidence of deep vein thrombosis.   Fernando [name] reports that he usually wears support stockings but is not wearing them today. Peripheral pulses are satisfactory. ?  SKIN:?There are no rashes and no cellulitis  PSYCH:?The patient is calm, rationale and oriented.    Cardiovascular Studies  A twelve-lead ECG was obtained and reveals atrial fibrillation with a heart rate of 69 bpm.  Incomplete right bundle branch block is noted.  Labs from March 30, 2020 revealed hemoglobin 14.3, platelet count 151 and white count 5.7.  His serum creatinine was 1.22 mg/dL and serum potassium 4.1 mmol/L.  His ALT = 20.  His hemoglobin A1c was 6.8%.  His total cholesterol was 123, triglycerides 55, HDL 46 and LDL cholesterol 66.  His T is TSH was 1.71 microunits/mL.    Echo Doppler Oct 28, 2017:  1. No regional wall motion abnormalities are seen. Overall LV systolic function appears normal. The estimated left ventricular ejection fraction is 60%. Left ventricular contractility appears similar when compared with the prior echocardiogram performed on 05/30/16.   2. There is mild concentric left ventricular hypertrophy.  3. Mild right ventricular enlargement with normal right ventricular contractility.  4. Mild biatrial enlargement.  5. Moderate aortic valve stenosis.  6. No pericardial effusion is seen.  7. The estimated peak systolic PA pressure =55 mmHg.      Regadenoson thallium stress test 06/20/2016:  Scintigraphic (planar/tomographic): There are no perfusion defects. ?All myocardial segments appear viable. Polar coordinate map identifies no perfusion abnormalities.?  Regional Wall Thickening and Motion Post Stress: There is abnormal septal motion with retained thickening consistent with the patient's history of prior cardiac surgery. Otherwise all other segments demonstrate normal left ventricular wall motion and thickening of all myocardial segments. Left Ventricular Ejection Fraction (post stress, in the resting state) = 60?%.?Left Ventricular End Diastolic Volume: 16?XW.?  SUMMARY/OPINION: ?This study is normal with no evidence of significant myocardial ischemia. Left ventricular systolic function is normal, the ejection fraction was 60%. There are no high risk prognostic indicators present. ?The pulmonary to myocardial count ratio is borderline at 0.51. ?There is no transient ischemic dilatation noted. ?The ECG portion of the study is negative for ischemia.?  There was a prior study obtained in our office on 03/06/2012. ?The images were not able to be restored for direct comparison. ?However, the report indicated the ejection fraction is 59%, the end-diastolic volume was 120 mL, the pulmonary marker to account ratio 0.40, there is no transient ischemic dilatation. ?The study also demonstrated no significant perfusion abnormalities. Overall it is not appear to be a significant change since the prior study.    Cardiovascular Health Factors  Vitals BP Readings from Last 3  Encounters:   05/31/20 136/78   09/10/19 138/80   12/30/18 122/72     Wt Readings from Last 3 Encounters:   05/31/20 116.7 kg (257 lb 3.2 oz)   09/10/19 122 kg (269 lb)   12/30/18 116.3 kg (256 lb 6.4 oz)     BMI Readings from Last 3 Encounters:   05/31/20 33.93 kg/m?   09/10/19 35.49 kg/m?   12/30/18 33.83 kg/m?      Smoking Social History     Tobacco Use   Smoking Status Never Smoker   Smokeless Tobacco Never Used      Lipid Profile Cholesterol   Date Value Ref Range Status   07/31/2018 142 (L) 150 - 200 Final     HDL   Date Value Ref Range Status   07/31/2018 46  Final     LDL   Date Value Ref Range Status 07/31/2018 93  Final     Triglycerides   Date Value Ref Range Status   07/31/2018 48  Final      Blood Sugar Hemoglobin A1C   Date Value Ref Range Status   07/25/2017 7.8 (H) 4.5 - 6.5 Final     Glucose   Date Value Ref Range Status   07/31/2018 148 (H) 70 - 105 Final   11/11/2017 121 (H) 83 - 110 Final   10/15/2017 139 (H) 83 - 110 Final   06/21/2005 129 (H) 70 - 110 MG/DL Final   09/81/1914 95 70 - 110 MG/DL Final   78/29/5621 308 (H) 70 - 110 MG/DL Final     Glucose, POC   Date Value Ref Range Status   06/22/2005 151 (H) 70 - 110 MG/DL Final   65/78/4696 295 (H) 70 - 110 MG/DL Final   28/41/3244 010 (H) 70 - 110 MG/DL Final          Problems Addressed Today  Encounter Diagnoses   Name Primary?   ? Coronary artery disease involving native coronary artery of native heart without angina pectoris Yes   ? Primary hypertension        Assessment and Plan     Mr. Wacha has accepted?atrial fibrillation?as his permanent cardiac rhythm and?reports no symptoms related to this arrhythmia.  He did have some questions today about cardioversion, antiarrhythmic therapy and cardiac arrhythmia ablation, but once again prefers a strategy of heart rate control and anticoagulation. Mr. Withey that because he is?asymptomatic, that?he did not want to consider cardioversion, antiarrhythmic drug therapy or cardiac arrhythmia ablation at this time. He does understand that the longer he remains in atrial fibrillation,?the more difficult it may be to convert his heart rhythm at a later time. His heart rate is well controlled. His CHA2DS2-VASc score is?4?for age, diabetes?and hypertension. ?The risks and benefits of anticoagulation therapy were once again extensively reviewed with the patient?in lay terms. The patient understands that anticoagulation is used to decrease thrombotic or clotting complications associated with atrial fibrillation/flutter, such as stroke and systemic embolization which can be disabling or fatal, but can, on occasion, lead to life-threatening bleeding complications including gastrointestinal or intracranial hemorrhage. Anticoagulation options were presented to the patient and he wanted to continue apixaban ??He has worsening pedal edema.  Alternatives to the treatment of hypertension were reviewed with the patient and he wanted to decrease his amlodipine from 10 mg daily to 5 mg daily since this may be contributing to his edema.  To help control his blood pressure he wanted to start hydrochlorothiazide 25 mg/triamterene 37.5 mg taking  1/2 tablet daily.  I have asked Mr. Arcari to repeat his Chem-7 in 1 month.  Possible adverse effects associated with diuretic therapy were reviewed with the patient in detail.  He was asked to report any worrisome symptoms.  I have encouraged him to wear support stockings. I have asked the patient to keep a log book of his BP readings and to report BP readings exceeding 130/80 mm Hg.  ?I have asked him to return for follow-up in approximately 4?months?to review his progress.         Current Medications (including today's revisions)  ? amLODIPine (NORVASC) 5 mg tablet Take 1 Tab by mouth twice daily. (Patient taking differently: Take 10 mg by mouth daily.)   ? apixaban (ELIQUIS) 5 mg tablet Take one tablet by mouth twice daily.   ? aspirin EC 81 mg tablet Take 81 mg by mouth daily. Take with food.   ? losartan (COZAAR) 50 mg tablet TAKE 1 TABLET EVERY DAY   ? metoprolol XL (TOPROL XL) 50 mg extended release tablet TAKE 1 TABLET EVERY DAY   ? nitroglycerin (NITROSTAT) 0.4 mg tablet Place 1 Tab under tongue every 5 minutes as needed for Chest Pain.   ? pioglitazone (ACTOS) 15 mg tablet Take 1 tablet by mouth daily.   ? simvastatin (ZOCOR) 20 mg tablet TAKE ONE TABLET BY MOUTH AT BEDTIME

## 2020-06-02 ENCOUNTER — Encounter: Admit: 2020-06-02 | Discharge: 2020-06-02 | Payer: MEDICARE

## 2020-06-02 NOTE — Telephone Encounter
Spoke with Jorja Loa, son regarding Fernando [name]'s CV on Wed 12/29.  Tim thought it was cancelled per his sister who went with him to his appointment.  Clarified with Gollub today.  CV is cancelled.  Called Tim back and spoke with his wife, that CV is cancelled.  RK

## 2020-06-29 ENCOUNTER — Encounter: Admit: 2020-06-29 | Discharge: 2020-06-29 | Payer: MEDICARE

## 2020-06-29 ENCOUNTER — Ambulatory Visit: Admit: 2020-06-29 | Discharge: 2020-06-29 | Payer: MEDICARE

## 2020-06-29 DIAGNOSIS — I10 Essential (primary) hypertension: Secondary | ICD-10-CM

## 2020-06-29 DIAGNOSIS — I2510 Atherosclerotic heart disease of native coronary artery without angina pectoris: Secondary | ICD-10-CM

## 2020-06-29 DIAGNOSIS — E785 Hyperlipidemia, unspecified: Secondary | ICD-10-CM

## 2020-06-29 MED ORDER — PERFLUTREN LIPID MICROSPHERES 1.1 MG/ML IV SUSP
1-20 mL | Freq: Once | INTRAVENOUS | 0 refills | Status: CP | PRN
Start: 2020-06-29 — End: ?

## 2020-06-29 NOTE — Telephone Encounter
-----   Message from Hester Mates, MD sent at 06/29/2020  2:45 PM CST -----  Fernando [name]: The echo Doppler study performed on 06/29/2020 was reviewed with Fernando [name] by phone on 06/29/2018. Doppler exam and visual assessment is suggestive for severe aortic valve stenosis.  I encouraged him to go to valve clinic at [facility] to discuss transcatheter or surgical aortic valve replacement with the structural heart team.  Fernando [name] insisted that he was asymptomatic, that his exercise tolerance was stable and quite good and that he did not want to consider any procedures at the present time.  I asked him to contact me if he changed his mind and wanted to be scheduled for valve clinic.  I placed an addendum on his clinic note from 05/31/2020 indicating the conversation.  If he calls back and wants to be scheduled for valve clinic, then please schedule him.  Thanks.  SBG

## 2020-07-28 NOTE — Progress Notes
86 yr old male  ER with c/o 2 weeks of dizziness, SOA, weakness  Hx of CAD, DM, Afib  On eliquis- missed 1 week about 3 weeks ago  Stated he had fallen around first of the year  Denies HA  No focal weakness  A&O  130/50, 70 afib- rate controlled, normal resp, 93% RA, 98.1  CXR- atypical viral pneumonia?  Covid testing pending  CT head- bilateral complex subdural hematomas, left greater than right ---Left 1.9cm  Right 1.4cm  Localized mas effect, no ML shift, consistent with acute on chronic SDH   WBC 7.1  Hgb 14.1  Plt 188  INR 1.4  CO2 21  Gap 16  Cr 1.36- BL 0.7-0.9  Gluc 241  BNP 705  Alk phos 164

## 2020-08-15 ENCOUNTER — Encounter: Admit: 2020-08-15 | Discharge: 2020-08-15 | Payer: MEDICARE

## 2020-08-15 ENCOUNTER — Ambulatory Visit: Admit: 2020-08-15 | Discharge: 2020-08-15 | Payer: MEDICARE

## 2020-08-15 DIAGNOSIS — E669 Obesity, unspecified: Secondary | ICD-10-CM

## 2020-08-15 DIAGNOSIS — I2510 Atherosclerotic heart disease of native coronary artery without angina pectoris: Secondary | ICD-10-CM

## 2020-08-15 DIAGNOSIS — I10 Essential (primary) hypertension: Secondary | ICD-10-CM

## 2020-08-15 DIAGNOSIS — S065X9A Traumatic subdural hemorrhage with loss of consciousness of unspecified duration, initial encounter: Secondary | ICD-10-CM

## 2020-08-15 DIAGNOSIS — M1990 Unspecified osteoarthritis, unspecified site: Secondary | ICD-10-CM

## 2020-08-15 DIAGNOSIS — E785 Hyperlipidemia, unspecified: Secondary | ICD-10-CM

## 2020-08-15 DIAGNOSIS — I4891 Unspecified atrial fibrillation: Secondary | ICD-10-CM

## 2020-08-15 DIAGNOSIS — M069 Rheumatoid arthritis, unspecified: Secondary | ICD-10-CM

## 2020-08-15 DIAGNOSIS — R569 Unspecified convulsions: Secondary | ICD-10-CM

## 2020-08-15 MED ORDER — LEVETIRACETAM 500 MG PO TAB
500 mg | ORAL_TABLET | Freq: Two times a day (BID) | ORAL | 3 refills | 90.00000 days | Status: AC
Start: 2020-08-15 — End: ?

## 2020-08-15 NOTE — Progress Notes
CT Head order faxed successfully to [name] at the patient's request. Fax# [phone]

## 2020-08-15 NOTE — Progress Notes
Date of Service: 08/15/2020    Subjective:             Fernando [name] is a 86 y.o. male.    History of Present Illness    Mr Botz is referred for evaluation of bilateral subacute subdural hematomas.  Patient has a chronic history of AFib. More recently he was placed on aspirin and Elliquis for that.  On Jul 27, 2020 he felt dizzy and was found to have bilateral subacute subdural hematomas on CT.  Aspirin and elliquis stopped. The next day, he had an episode of seizure, his repeat CT scan was stable so patient was started on Keppra 750mg  po BID.  He did not have an episode of seizure after that, but he has been complaining of drowsiness.  Currently he is doing well clinically.  No significant headache. No focal deficit.  I reviewed his CT scan and notes from [name]       Review of Systems   Neurological: Positive for seizures.        Near syncope     All other systems reviewed and are negative.    Medical History:   Diagnosis Date   ? Arthritis    ? Atrial fibrillation (HCC)    ? CAD (coronary artery disease) 11/11/2008   ? Hyperlipidemia 11/11/2008   ? Hypertension 11/11/2008   ? Obesity 11/11/2008   ? Rheumatoid arthritis(714.0)      Surgical History:   Procedure Laterality Date   ? CARDIAC SURGERY     ? KNEE SURGERY       Family History   Problem Relation Age of Onset   ? Sudden Cardiac Death Mother    ? Sudden Cardiac Death Father    ? Cancer Other    ? Arthritis-rheumatoid Other      Social History     Socioeconomic History   ? Marital status: Widowed     Spouse name: Not on file   ? Number of children: Not on file   ? Years of education: Not on file   ? Highest education level: Not on file   Occupational History   ? Not on file   Tobacco Use   ? Smoking status: Never Smoker   ? Smokeless tobacco: Never Used   Substance and Sexual Activity   ? Alcohol use: No   ? Drug use: No   ? Sexual activity: Not on file   Other Topics Concern   ? Not on file   Social History Narrative   ? Not on file Objective:         ? amLODIPine (NORVASC) 5 mg tablet Take one tablet by mouth at bedtime daily.   ? apixaban (ELIQUIS) 5 mg tablet Take one tablet by mouth twice daily.   ? aspirin EC 81 mg tablet Take 81 mg by mouth daily. Take with food.   ? losartan (COZAAR) 50 mg tablet TAKE 1 TABLET EVERY DAY   ? metoprolol XL (TOPROL XL) 50 mg extended release tablet TAKE 1 TABLET EVERY DAY   ? nitroglycerin (NITROSTAT) 0.4 mg tablet Place 1 Tab under tongue every 5 minutes as needed for Chest Pain.   ? pioglitazone (ACTOS) 15 mg tablet Take 1 tablet by mouth daily.   ? simvastatin (ZOCOR) 20 mg tablet TAKE ONE TABLET BY MOUTH AT BEDTIME   ? triamterene-hydrochlorothiazide (MAXZIDE-25MG ) 37.5-25 mg tablet Take one-half tablet by mouth daily.     There were no vitals filed for this  visit.  There is no height or weight on file to calculate BMI.     Physical Exam  Constitutional:       Appearance: Normal appearance.   HENT:      Head: Normocephalic and atraumatic.   Eyes:      Extraocular Movements: Extraocular movements intact.      Conjunctiva/sclera: Conjunctivae normal.      Pupils: Pupils are equal, round, and reactive to light.   Neurological:      General: No focal deficit present.      Mental Status: He is alert and oriented to person, place, and time.      Cranial Nerves: Cranial nerves are intact.      Motor: Motor function is intact.   Psychiatric:         Mood and Affect: Mood normal.         Behavior: Behavior normal.              Assessment and Plan:    Bilateral subacute subdural hematoma  I discussed the condition with him, his daughter and his friend.  At this point, I see no indication for a neurosurgical intervention.  I recommend observation. If the subdural increases in size over time, then he will need an intervention.  I will see him back in 4 weeks with a follow-up CT scan.  Regarding the keppra, I will put him on 500mg  po BID because of the drowsiness. He will need to follow-up with neurology after that.  He also need to follow-up with his cardiologist for the AFib.  I recommend that he stays off Elliquis at this time. Potentially he can be on aspirin if deemed necessary by his cardiologist.

## 2020-08-22 ENCOUNTER — Encounter: Admit: 2020-08-22 | Discharge: 2020-08-22 | Payer: MEDICARE

## 2020-08-22 NOTE — Telephone Encounter
Patient requested CT imaging order to be faxed to [facility] in [place], [place]. Fax sent successfully.

## 2020-09-07 NOTE — Progress Notes
Obtained patient's verbal consent to treat them and their agreement to [facility] financial policy and NPP via this telehealth visit during the [facility]    Fernando [name] returns to the clinic for a FUV.   He is seen today through telehealth.  A repeat CT of the head was done at an OSH for surveillance of bilateral subacute subdural hematoma.   It is available for review on PACS    Clinically he is doing very well  No complaints at this point.  His CT scan shows significant decrease in the size of the subdural collection.    A/P:  - no indication for a neurosurgical intervention. He will follow-up with [place] as needed  - he is still on keppra 500 BID, and has a a follow-up appointment with neurology  - he is still off aspirin and Eliquis. He has a follow-up appointment with cardiology.  From neurosurgical standpoint, these meds can be resumed if they are strongly indicated by cardiology

## 2020-09-12 ENCOUNTER — Ambulatory Visit: Admit: 2020-09-12 | Discharge: 2020-09-13 | Payer: MEDICARE

## 2020-09-12 ENCOUNTER — Encounter: Admit: 2020-09-12 | Discharge: 2020-09-12 | Payer: MEDICARE

## 2020-09-12 DIAGNOSIS — M069 Rheumatoid arthritis, unspecified: Secondary | ICD-10-CM

## 2020-09-12 DIAGNOSIS — M1990 Unspecified osteoarthritis, unspecified site: Secondary | ICD-10-CM

## 2020-09-12 DIAGNOSIS — I4891 Unspecified atrial fibrillation: Secondary | ICD-10-CM

## 2020-09-12 DIAGNOSIS — E669 Obesity, unspecified: Secondary | ICD-10-CM

## 2020-09-12 DIAGNOSIS — S065X9A Traumatic subdural hemorrhage with loss of consciousness of unspecified duration, initial encounter: Secondary | ICD-10-CM

## 2020-09-12 DIAGNOSIS — I2510 Atherosclerotic heart disease of native coronary artery without angina pectoris: Secondary | ICD-10-CM

## 2020-09-12 DIAGNOSIS — E785 Hyperlipidemia, unspecified: Secondary | ICD-10-CM

## 2020-09-12 DIAGNOSIS — I10 Essential (primary) hypertension: Secondary | ICD-10-CM

## 2020-09-12 NOTE — Telephone Encounter
Pt's daughter left message letting us know that Fernando [name] saw neurology today and was told that if okay with cardiology, he could restart his aspirin and Eliquis.      From Fernando [name] note:  A/P:  - no indication for a neurosurgical intervention. He will follow-up with [place] as needed  - he is still on keppra 500 BID, and has a a follow-up appointment with neurology  - he is still off aspirin and Eliquis. He has a follow-up appointment with cardiology.  From neurosurgical standpoint, these meds can be resumed if they are strongly indicated by cardiology    Pt has follow up with Fernando [name] on 4/21.  Will route to Fernando [name] for any recommendations prior to patient's upcoming office visit.

## 2020-09-12 NOTE — Patient Instructions
Please follow up with Dr. Francis Gaines as needed for neurosurgical concerns. No neurosurgical follow up appointment indicated at this time.     Herbert Seta, RN [phone]          Fall?Prevention  Falls often take place due to slipping, tripping, or losing your balance. Millions of people fall every year and injure themselves.?Among older adults in the [place] falls are the most common cause of traumatic brain injuries. Every 20 minutes, an older adult dies from a fall. Here are ways to reduce your risk of falling again:   ? Think about your fall. Was there anything that caused your fall that can be fixed, removed, or replaced?  ? Make your home safe by keeping walkways clear of objects you may trip over, such as electrical cords.  ? Use nonslip pads under rugs. Don't use area rugs or small throw rugs.  ? Use nonslip mats in bathtubs and showers.  ? Hang grab rails by the toilet and inside and outside the shower.  ? Install handrails and lights on staircases. The handrails should be on both sides of the stairs.  ? Use night lights.  ? Don't walk in poorly lit areas.  ? Don't stand on chairs or wobbly ladders.  ? Use care when reaching overhead or looking up.?This position can cause a loss of balance.  ? Be sure your shoes fit well, are in good condition, and have nonslip bottoms.?  ? Wear shoes both inside and outside of your home. Don't go barefoot or wear slippers.  ? Be cautious when going up and down stairs, curbs, and when walking on uneven sidewalks.  ? If your balance is poor, consider using a cane or walker. Talk with your healthcare provider about having a balance assessment.  ? If your fall was related to alcohol use, stop or limit alcohol intake.?Ask your provider for help if you think you may overuse alcohol and can't stop.  ? If your fall was related to use of sleeping medicines, talk with your provider about this.?You may need to reduce your dosage at bedtime if you wake up during the night to go to the bathroom.??  ? To reduce the need for nighttime bathroom trips:  ? Don't drink fluids for several hours before going to bed  ? Empty your bladder before going to bed  ? Men can keep a urinal at the bedside  ? Stay as active as you can. Balance, flexibility, strength, and endurance all come from exercise. They all play a role in preventing falls. Ask your provider which types of activity are right for you. Try to do some type of exercise every day.  ? Get your eyes checked once a year or more often if your vision changes  ? If you have pets, know where they are before you stand up or walk so you don't trip over them.  ? Go over all your medicines with a pharmacist or other provider. This is to see if any of them could make you more likely to fall. Have this type of medicine review at least once every year.  ? If your provider advises a new medicine, ask if the side effects will affect your balance.  ? Don't move quickly from one position to another. For instance, don't stand up fast from sitting. This can cause dizziness and may lead to a fall.  ? Sit down when putting on pants, socks, and shoes. This will make you less likely to lose your balance  and fall.  ? Always let your provider know if you have fallen since your last visit.  ? Contact your provider right away if you're having balance problems or falling more often.  [employer] last reviewed this educational content on 06/11/2020    ? 2000-2022 [employer], [place]. All rights reserved. This information is not intended as a substitute for professional medical care. Always follow your healthcare professional's instructions.          Standard Precautions: Handwashing  Frequent and thorough handwashing is the best way to prevent infection. The sooner you wash your hands after exposure, the less likely you are to catch or spread infection.      Washing your hands is the best way to stop the spread of infection.   When to wash your hands  Wash your hands regularly throughout the day, especially:   ? When first arriving at work and before leaving  ? Before and after caring for a patient  ? After touching blood or any other body fluid or substance, broken skin, or mucous membranes  ? After touching an object or surface that is or may be contaminated  ? Before and after eating, drinking, smoking, and after using the restroom  ? After coughing, sneezing, or blowing your nose  How to wash your hands  First, carefully remove gloves and other PPE. Follow your facility?s guidelines for dealing with jewelry. Then follow these steps:   ? Use clean, running water and plenty of soap. Work up a good Education officer, museum by rubbing your hands together.  ? Clean your whole hand, under your nails, between your fingers, and up your wrists. Rub vigorously. Wash for at least 20?seconds (or the time it takes to sing happy birthday twice. (CDC).  ? Rinse your hands well. Let the water run off your fingertips, not up your wrists.  ? Dry your hands well with clean paper towels. Or use an air dryer machine. Use paper towels to turn off the faucet and open the door so you don?t recontaminate your hands.  ? If no sink is available or your hands are not visibly soiled, use the alcohol-based hand sanitizer approved by your facility. Be sure it contains no less than 60% alcohol. These products are fast-acting and significantly reduce the number of germs on the skin. Unfortunately, they don't work on all types of germs in the hospital.?Wash with soap and water as soon as you can.  [employer]Well last reviewed this educational content on 01/10/2020  ? 2000-2021 [employer], [place]. All rights reserved. This information is not intended as a substitute for professional medical care. Always follow your healthcare professional's instructions.

## 2020-09-27 ENCOUNTER — Encounter: Admit: 2020-09-27 | Discharge: 2020-09-27 | Payer: MEDICARE

## 2020-09-29 ENCOUNTER — Encounter: Admit: 2020-09-29 | Discharge: 2020-09-29 | Payer: MEDICARE

## 2020-09-29 DIAGNOSIS — I2510 Atherosclerotic heart disease of native coronary artery without angina pectoris: Secondary | ICD-10-CM

## 2020-09-29 DIAGNOSIS — I10 Essential (primary) hypertension: Secondary | ICD-10-CM

## 2020-09-29 DIAGNOSIS — E785 Hyperlipidemia, unspecified: Secondary | ICD-10-CM

## 2020-09-29 DIAGNOSIS — M1990 Unspecified osteoarthritis, unspecified site: Secondary | ICD-10-CM

## 2020-09-29 DIAGNOSIS — I4891 Unspecified atrial fibrillation: Secondary | ICD-10-CM

## 2020-09-29 DIAGNOSIS — E669 Obesity, unspecified: Secondary | ICD-10-CM

## 2020-09-29 DIAGNOSIS — M069 Rheumatoid arthritis, unspecified: Secondary | ICD-10-CM

## 2020-09-29 LAB — BASIC METABOLIC PANEL
Lab: 1.4 — ABNORMAL HIGH (ref 0.72–1.25)
Lab: 106
Lab: 139
Lab: 177 — ABNORMAL HIGH (ref 70–105)
Lab: 26
Lab: 27 — ABNORMAL HIGH (ref 8.4–25.7)
Lab: 4.8
Lab: 9.2

## 2020-09-29 NOTE — Progress Notes
Date of Service: 09/29/2020    Fernando [name] is a 86 y.o. male.       HPI     Fernando [name] been followed in the past for coronary artery disease,?having undergone coronary bypass surgery in 2007.  He has also been followed for permanent atrial fibrillation.  Most recently he fell in December 2021.  Over the next 2 months he developed abnormalities in balance and then evaluation revealed a subdural hematoma.  He did have a seizure.  He was taken off his aspirin and Eliquis and surgical intervention was not required.  He was placed on Keppra to prevent recurrent seizures which his daughter believes has led to some lethargy.  ?His CHA2DS2-VASc score is 5 for age,?diabetes, peripheral vascular disease?and hypertension.? FernandoMendoza has been receiving treatment for recurrent venous stasis dermatitis with a healing left pretibial ulcer. Otherwise,?the patient?reports that he continues to feel well and has no complaints.  The patient reports no angina, congestive symptoms, palpitations, sensation of sustained forceful heart pounding, lightheadedness or syncope. The patient reports no myalgias, bleeding abnormalities, or new neurologic abnormalities. On May 24, 2016?during a?routine visit,?a routine ECG revealed atrial fibrillation.?At that time he reported no palpitations or sensation of sustained forceful heart pounding.  He deferred anticoagulation until November 2021 when he started apixaban.       Vitals:    09/29/20 0905   BP: 120/60   BP Source: Arm, Left Upper   Pulse: 78   SpO2: 96%   O2 Device: None (Room air)   PainSc: Zero   Weight: 124.9 kg (275 lb 6.4 oz)   Height: 188 cm (6' 2)     Body mass index is 35.36 kg/m?[name]     Past Medical History  Patient Active Problem List    Diagnosis Date Noted   ? Subdural hematoma (HCC) 08/15/2020   ? Permanent atrial fibrillation (HCC) 06/29/2020   ? Osteoarthrosis, unspecified whether generalized or localized, lower leg 03/11/2012   ? HTN (hypertension) 03/11/2012   ? Primary localized osteoarthrosis, lower leg 12/18/2011   ? Pain in joint, lower leg 12/18/2011   ? S/P knee replacement 12/18/2011   ? Knee pain 10/25/2011   ? Osteoarthritis of knee 10/25/2011   ? 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 CAD (coronary artery disease) 11/11/2008     status post CABG on 06/18/2005 (patient received a LIMA to      LAD, reverse SVG to OM branch, reverse SVG to mid-RCA and reverse SVG to second      diagonal).  Patient is angina free and he is a functional Class I.  History of chest pain and admission in [facility] in 11/2007.  Exercise myocardial perfusion imaging study dated 11/13/2007, unremarkable for      ischemia.     ? Obesity 11/11/2008   ? Hyperlipidemia 11/11/2008   ? Hypertension, essential 11/11/2008         Review of Systems   Constitutional: Positive for malaise/fatigue and weight gain.   HENT: Negative.    Eyes: Negative.    Cardiovascular: Positive for dyspnea on exertion and leg swelling.   Respiratory: Negative.    Endocrine: Negative.    Hematologic/Lymphatic: Negative.    Skin: Positive for poor wound healing.   Musculoskeletal: Positive for falls.   Gastrointestinal: Negative.    Genitourinary: Negative.    Neurological: Positive for excessive daytime sleepiness, numbness and paresthesias.   Psychiatric/Behavioral: Negative.    Allergic/Immunologic: Negative.  Physical Exam  GENERAL: The patient is well developed, well nourished, resting comfortably and in no distress.   HEENT: No abnormalities of the visible oro-nasopharynx, conjunctiva or sclera are noted.  NECK: There is no jugular venous distension. Carotids are palpable and without bruits. There is no thyroid enlargement.  Chest: Lung fields are clear to auscultation. There are no wheezes or crackles.  CV: There is an irregular rhythm?with variable intensity of the first heart sound.??A?Grade 3/6 systolic ejection murmur is heard near the right?upper sternal border. ?There are no diastolic murmurs, gallops or rubs. ?His apical heart rate is?76?bpm.  ABD: The abdomen is soft and supple with normal bowel sounds. There is no hepatosplenomegaly, ascites, tenderness, masses or bruits.  Neuro: There are no focal motor defects. Ambulation is normal. Cognitive function is not quite as sharp as it was prior to the subdural hematoma but not bad for an 86 year old man.    Ext:?There 3+?bipedal edema without ?evidence of deep vein thrombosis.?    Chronic venous stasis dermatitis.  Healing left pretibial ulcer.   ?  SKIN:?There are no rashes and no cellulitis  PSYCH:?The patient is calm, rationale and oriented    Cardiovascular Studies  A twelve-lead ECG obtained on May 31, 2020 shows atrial fibrillation with a heart rate of 69 bpm.  Nondiagnostic ST-T wave abnormalities are noted along with incomplete right bundle branch block.    Echo Doppler 06/29/2020:  Interpretation Summary    1. No regional wall motion abnormalities are seen. Overall LV systolic function appears normal. The estimated left ventricular ejection fraction is 55-60%. ?Left ventricular systolic function appears mildly less dynamic when compared with the prior study performed on 10/28/2017 but still remains within normal limits.  2. There is mild concentric left ventricular hypertrophy.  3. The interventricular septum appears somewhat D-shaped, suggestive for right ventricular pressure and volume overload. The right ventricle is moderately dilated. The right ventricular systolic function is mildly reduced.  4. Moderate right atrial enlargement.  5. Doppler exam and visual assessment is suggestive for severe aortic valve stenosis.  6. Mild mitral valve regurgitation and moderate tricuspid valve regurgitation are noted by Doppler exam.  7. No pericardial effusion is seen.  8. Pulmonary hypertension: The estimated peak systolic PA pressure = 70 mmHg.    Regadenoson thallium stress test 06/20/2016:  Scintigraphic (planar/tomographic): There are no perfusion defects. ?All myocardial segments appear viable. Polar coordinate map identifies no perfusion abnormalities.?  Regional Wall Thickening and Motion Post Stress: There is abnormal septal motion with retained thickening consistent with the patient's history of prior cardiac surgery. Otherwise all other segments demonstrate normal left ventricular wall motion and thickening of all myocardial segments. Left Ventricular Ejection Fraction (post stress, in the resting state) = 60?%.?Left Ventricular End Diastolic Volume: 16?XW.?  SUMMARY/OPINION: ?This study is normal with no evidence of significant myocardial ischemia. Left ventricular systolic function is normal, the ejection fraction was 60%. There are no high risk prognostic indicators present. ?The pulmonary to myocardial count ratio is borderline at 0.51. ?There is no transient ischemic dilatation noted. ?The ECG portion of the study is negative for ischemia.?  There was a prior study obtained in our office on 03/06/2012. ?The images were not able to be restored for direct comparison. ?However, the report indicated the ejection fraction is 59%, the end-diastolic volume was 120 mL, the pulmonary marker to account ratio 0.40, there is no transient ischemic dilatation. ?The study also demonstrated no significant perfusion abnormalities. Overall it is not appear to be  a significant change since the prior study.    Cardiovascular Health Factors  Vitals BP Readings from Last 3 Encounters:   09/29/20 120/60   08/15/20 (!) 143/77   06/29/20 (!) 179/89     Wt Readings from Last 3 Encounters:   09/29/20 124.9 kg (275 lb 6.4 oz)   09/12/20 113.4 kg (250 lb)   08/15/20 115.5 kg (254 lb 11.2 oz)     BMI Readings from Last 3 Encounters:   09/29/20 35.36 kg/m?   09/12/20 32.10 kg/m?   08/15/20 35.52 kg/m?      Smoking Social History     Tobacco Use   Smoking Status Never Smoker   Smokeless Tobacco Never Used      Lipid Profile Cholesterol   Date Value Ref Range Status   03/30/2020 123  Final     HDL   Date Value Ref Range Status   03/30/2020 46  Final     LDL   Date Value Ref Range Status   03/30/2020 66  Final     Triglycerides   Date Value Ref Range Status   03/30/2020 55  Final      Blood Sugar Hemoglobin A1C   Date Value Ref Range Status   03/30/2020 6.8 (H)  Final     Glucose   Date Value Ref Range Status   07/30/2020 204 (H) 70 - 105 Final   07/28/2020 164 (H) 70 - 105 Final   03/30/2020 122 (H)  Final   06/21/2005 129 (H) 70 - 110 MG/DL Final   16/03/9603 95 70 - 110 MG/DL Final   54/02/8118 147 (H) 70 - 110 MG/DL Final     Glucose, POC   Date Value Ref Range Status   06/22/2005 151 (H) 70 - 110 MG/DL Final   82/95/6213 086 (H) 70 - 110 MG/DL Final   57/84/6962 952 (H) 70 - 110 MG/DL Final          Problems Addressed Today  Encounter Diagnoses   Name Primary?   ? Hypertension, essential Yes   ? Coronary artery disease involving native coronary artery of native heart without angina pectoris    ? Primary hypertension        Assessment and Plan   1) permanent atrial fibrillation. His CHA2DS2-VASc score is 5 for age,?diabetes, peripheral vascular disease?and hypertension.? I had a lengthy discussion with FernandoGosney and his daughter Selena Batten today.  They do not want to restart aspirin or Eliquis at this time out of concern of recurrent intracranial bleeding.  They understand that aspirin is used as an antithrombotic in patients with prior coronary bypass surgery and that anticoagulation is used for stroke prevention in patients with atrial fibrillation.  However, they are willing to agree to 45 days of anticoagulation if the patient is a candidate for a Watchman left atrial appendage occlusion device.  I have requested an expedited evaluation for placement of the Watchman device for FernandoWenz, and he is scheduled to see Dr. Wallene Huh on October 04, 2020.  Once again Mr. Baladez and his daughter Selena Batten indicated they would agree to limited anticoagulation if the Watchman left atrial appendage device was feasible. Dr. Francis Gaines has been the neurosurgeon who has evaluated Mr. Solomon. Dr. Quinn Plowman last note dated 09/12/2020 in Epic indicated:he is still off aspirin and Eliquis. He has a follow-up appointment with cardiology. From neurosurgical standpoint, these meds can be resumed if they are strongly indicated by cardiology.  2) Mr. Neilan's blood pressure is currently well controlled.  I have asked him to stop amlodipine which could be contributing to his edema and his chronic venous stasis dermatitis.  He was instructed to monitor his blood pressure closely.  If necessary an additional antihypertensive medication such as spironolactone or diltiazem could be used for control of hypertension.  3)Mr. Gritz was instructed to be very attentive to wound care to see if we can get his venous stasis dermatitis under excellent control.  4)Mr. Brigandi appears to have severe asymptomatic aortic valve stenosis.  He is not interested in intervention at this time.  5) I have asked FernandoKassis to return to my clinic within the next 3 months or sooner to follow his progress. The total time spent during this interview and exam was 45 minutes.         Current Medications (including today's revisions)  ? furosemide (LASIX) 40 mg tablet Take 1 tablet by mouth daily.   ? levETIRAcetam (KEPPRA) 500 mg tablet Take one tablet by mouth twice daily.   ? losartan (COZAAR) 50 mg tablet TAKE 1 TABLET EVERY DAY   ? metoprolol XL (TOPROL XL) 50 mg extended release tablet TAKE 1 TABLET EVERY DAY   ? nitroglycerin (NITROSTAT) 0.4 mg tablet Place 1 Tab under tongue every 5 minutes as needed for Chest Pain.   ? pioglitazone (ACTOS) 30 mg tablet Take 1 tablet by mouth daily.   ? potassium chloride SR (K-DUR) 20 mEq tablet Take 1 tablet by mouth daily. Take with a meal and a full glass of water.   ? simvastatin (ZOCOR) 20 mg tablet TAKE ONE TABLET BY MOUTH AT BEDTIME

## 2020-10-04 ENCOUNTER — Encounter: Admit: 2020-10-04 | Discharge: 2020-10-04 | Payer: MEDICARE

## 2020-10-05 ENCOUNTER — Encounter: Admit: 2020-10-05 | Discharge: 2020-10-05 | Payer: MEDICARE

## 2020-10-05 DIAGNOSIS — M1990 Unspecified osteoarthritis, unspecified site: Secondary | ICD-10-CM

## 2020-10-05 DIAGNOSIS — I4891 Unspecified atrial fibrillation: Secondary | ICD-10-CM

## 2020-10-05 DIAGNOSIS — I10 Essential (primary) hypertension: Secondary | ICD-10-CM

## 2020-10-05 DIAGNOSIS — E785 Hyperlipidemia, unspecified: Secondary | ICD-10-CM

## 2020-10-05 DIAGNOSIS — I4821 Permanent atrial fibrillation: Secondary | ICD-10-CM

## 2020-10-05 DIAGNOSIS — M069 Rheumatoid arthritis, unspecified: Secondary | ICD-10-CM

## 2020-10-05 DIAGNOSIS — E669 Obesity, unspecified: Secondary | ICD-10-CM

## 2020-10-05 DIAGNOSIS — I2510 Atherosclerotic heart disease of native coronary artery without angina pectoris: Secondary | ICD-10-CM

## 2020-10-05 MED ORDER — APIXABAN 2.5 MG PO TAB
2.5 mg | ORAL_TABLET | Freq: Two times a day (BID) | ORAL | 5 refills | Status: AC
Start: 2020-10-05 — End: ?

## 2020-10-05 MED ORDER — FUROSEMIDE 20 MG PO TAB
20 mg | ORAL_TABLET | Freq: Every morning | ORAL | 1 refills | 90.00000 days | Status: AC
Start: 2020-10-05 — End: ?

## 2020-10-06 ENCOUNTER — Encounter: Admit: 2020-10-06 | Discharge: 2020-10-06 | Payer: MEDICARE

## 2020-10-06 DIAGNOSIS — M069 Rheumatoid arthritis, unspecified: Secondary | ICD-10-CM

## 2020-10-06 DIAGNOSIS — M1990 Unspecified osteoarthritis, unspecified site: Secondary | ICD-10-CM

## 2020-10-06 DIAGNOSIS — I10 Essential (primary) hypertension: Secondary | ICD-10-CM

## 2020-10-06 DIAGNOSIS — I4891 Unspecified atrial fibrillation: Secondary | ICD-10-CM

## 2020-10-06 DIAGNOSIS — I2510 Atherosclerotic heart disease of native coronary artery without angina pectoris: Secondary | ICD-10-CM

## 2020-10-06 DIAGNOSIS — E669 Obesity, unspecified: Secondary | ICD-10-CM

## 2020-10-06 DIAGNOSIS — E785 Hyperlipidemia, unspecified: Secondary | ICD-10-CM

## 2020-10-11 ENCOUNTER — Encounter: Admit: 2020-10-11 | Discharge: 2020-10-11 | Payer: MEDICARE

## 2020-10-14 ENCOUNTER — Encounter: Admit: 2020-10-14 | Discharge: 2020-10-14 | Payer: MEDICARE

## 2020-10-19 ENCOUNTER — Encounter: Admit: 2020-10-19 | Discharge: 2020-10-19 | Payer: MEDICARE

## 2020-10-19 NOTE — Telephone Encounter
Initial Visit Date New patient office visit planned for 10/21/20 Winona Legato MD    Reason for Visit: Seizure  Hx bilateral subacute subdural hematoma    "On Jul 27, 2020 he felt dizzy and was found to have bilateral subacute subdural hematomas on CT.  Aspirin and elliquis stopped. The next day, he had an episode of seizure, his repeat CT scan was stable so patient was started on Keppra 750mg  po BID.  He did not have an episode of seizure after that "    Any recent injuries?    Previous Diagnosis of Epilepsy?     If Yes? Then by who and at what age?      Physician Information  PCP: MD  Referring Physician:  Steva Ready MD  Previous Neurologist:    Other providers seen:      Previous Imaging/Procedures:  09/08/20 CT head   07/28/20 CT head      Recent ED/Hospitalizations:   07/29/20 [facility] : bilateral subacute subdural hematoma     Current Meds: Keppra 500 mg twice daily    Current Allergies:  Sulfa    Past Tried and Failed AED:  n/a

## 2020-10-21 ENCOUNTER — Encounter: Admit: 2020-10-21 | Discharge: 2020-10-21 | Payer: MEDICARE

## 2020-10-21 ENCOUNTER — Ambulatory Visit: Admit: 2020-10-21 | Discharge: 2020-10-22 | Payer: MEDICARE

## 2020-10-21 DIAGNOSIS — I4891 Unspecified atrial fibrillation: Secondary | ICD-10-CM

## 2020-10-21 DIAGNOSIS — I2510 Atherosclerotic heart disease of native coronary artery without angina pectoris: Secondary | ICD-10-CM

## 2020-10-21 DIAGNOSIS — M1990 Unspecified osteoarthritis, unspecified site: Secondary | ICD-10-CM

## 2020-10-21 DIAGNOSIS — I10 Essential (primary) hypertension: Secondary | ICD-10-CM

## 2020-10-21 DIAGNOSIS — M069 Rheumatoid arthritis, unspecified: Secondary | ICD-10-CM

## 2020-10-21 DIAGNOSIS — E785 Hyperlipidemia, unspecified: Secondary | ICD-10-CM

## 2020-10-21 DIAGNOSIS — E669 Obesity, unspecified: Secondary | ICD-10-CM

## 2020-10-21 MED ORDER — LEVETIRACETAM 750 MG PO TB24
1500 mg | ORAL_TABLET | Freq: Every day | ORAL | 5 refills | 90.00000 days | Status: AC
Start: 2020-10-21 — End: ?

## 2020-10-21 NOTE — Patient Instructions
Change to Keppra XR 750 mg BID  CT Head in 3 weeks         -- Preferred method of communication is through [employer], if the issue cannot wait until your next scheduled follow up.    -- MyChart may be used for non-emergent communication. Emails are not reviewed after hours or over the weekend/holidays/after 4PM. Staff will reply to your email within 24-48 business hours.    -- If you do not hear from [place] within one week of a lab or imaging study being completed, please call/send my chart email to the office to be sure that we have received the results. This is especially challenging when tests are done outside of the [facility] system, as many times results do not make it back to our office for a variety of reasons. In our office no news is good news does not apply. You should hear from [place] with results for each test.    -- If you are having acute (new/sudden onset) or severe/worsening neurologic symptoms, please call 911 or seek care in ED.    -- For referrals placed during the visit, if you have not heard from scheduling within one week, please call the call center at [phone] to get scheduling assistance.    -- For refills on medications, please first contact your pharmacy, who will fax a refill authorization request form to our office. Weekdays only. Allow up to 2 business days for refills. Please plan ahead.    -- Our front desk staff may be reached at [phone] for scheduling needs.    -- My nurse, may be contacted at [phone], option 2 for urgent needs. Staff will return your call within 24 business hours.    For Appointments:    -- Please try to arrive early for your appointment time to help facilitate your visit. 15 minutes early is recommended.    -- If you are late to your appointment, we reserve the right to ask you to reschedule or wait until next available time to be seen in fairness to other patients scheduled that day.    -- There are times when we are running behind in clinic. Our goal is to always be on time, however, there are time when unexpected events occur with patients, which may cause a delay. We appreciate your understanding when this occurs.

## 2020-10-22 DIAGNOSIS — R569 Unspecified convulsions: Secondary | ICD-10-CM

## 2020-10-22 DIAGNOSIS — S065X9A Traumatic subdural hemorrhage with loss of consciousness of unspecified duration, initial encounter: Secondary | ICD-10-CM

## 2020-10-23 ENCOUNTER — Encounter: Admit: 2020-10-23 | Discharge: 2020-10-23 | Payer: MEDICARE

## 2020-10-24 ENCOUNTER — Encounter: Admit: 2020-10-24 | Discharge: 2020-10-24 | Payer: MEDICARE

## 2020-10-24 NOTE — Telephone Encounter
Called patient to let him know that Watchman would not be able to be scheduled until November.  Schedules are not finalized yet and I would call as soon as possible.  Patient verbalized understanding.

## 2020-10-28 ENCOUNTER — Encounter: Admit: 2020-10-28 | Discharge: 2020-10-28 | Payer: MEDICARE

## 2020-10-28 NOTE — Telephone Encounter
10/28/20  Voice mail message received from pt's PCP DR. Eplee in [place]. [phone]  Pt needs reassurance to take AED's as prescribed.     Noted.   I plan to contact pt and remind to take AED's as prescribed. to help prevent seizures.     Monna Fam, RN

## 2020-11-01 ENCOUNTER — Inpatient Hospital Stay: Admit: 2020-11-01 | Discharge: 2020-11-01 | Payer: MEDICARE

## 2020-11-01 ENCOUNTER — Encounter: Admit: 2020-11-01 | Discharge: 2020-11-01 | Payer: MEDICARE

## 2020-11-01 DIAGNOSIS — I4821 Permanent atrial fibrillation: Secondary | ICD-10-CM

## 2020-11-01 MED ORDER — LIDOCAINE HCL 2 % MM JELP
Freq: Once | TOPICAL | 0 refills
Start: 2020-11-01 — End: ?

## 2020-11-01 MED ORDER — CEFAZOLIN INJ 1GM IVP
2 g | Freq: Once | INTRAVENOUS | 0 refills
Start: 2020-11-01 — End: ?

## 2020-11-01 MED ORDER — ASPIRIN 325 MG PO TAB
325 mg | Freq: Once | ORAL | 0 refills | Status: AC
Start: 2020-11-01 — End: ?

## 2020-11-01 MED ORDER — LIDOCAINE (PF) 10 MG/ML (1 %) IJ SOLN
.2 mL | INTRAMUSCULAR | 0 refills | PRN
Start: 2020-11-01 — End: ?

## 2020-11-01 MED ORDER — SODIUM CHLORIDE 0.9 % IV SOLP
INTRAVENOUS | 0 refills
Start: 2020-11-01 — End: ?

## 2020-11-01 NOTE — Patient Instructions
ELECTROPHYSIOLOGY PRE-ADMISSION INSTRUCTIONS    Patient Name: Fernando [name]  MRN#: [id number]  Date of Birth: 1934-07-16 (86 y.o.)  Today's Date: 11/01/2020    PROCEDURE:  You are scheduled for Left Atrial Appendage Closure Device Placement (Watchman) with Dr. Adella Hare Dendi.      ARRIVAL TIME:  Please report to the [facility] admitting office on the ground floor of the [facility] on: 03/21/2021  The EP Lab will call to notify you of your arrival time.  They will call on the business day prior to your procedure.  (If you have any questions regarding your arrival time for the Electrophysiology Lab, please call the EP Lab at [phone].)    PRE-PROCEDURE APPOINTMENTS:  03/07/2021 at 1:30 pm Pre-Operative Assessment Clinic visit with Anesthesia Department.  Go to the main hospital entrance of the [facility]. The anesthesia clinic is just inside the entrance on the left side of lobby -  across from the Information Desk.     03/07/2021 Pre-Admission lab work: BMP, CBC and Magnesium at your Pre-Operative Assessment Visit.     03/07/2021 at 3:30 pm   Office visit to update history and physical (requirement within 30 days of procedure) with Roosvelt Maser, APRN at [facility]       SPECIAL MEDICATION INSTRUCTIONS  Nothing to eat or drink after midnight before your procedure.  Take your prescription medications with a sip of water as instructed.  No caffeine for 24 hours prior to your procedure.      Any new prescriptions will be sent to your pharmacy listed on file with [place].   HOLD ALL over the counter vitamins or supplements on the morning of your procedure.  Diuretics: Lasix (furosemide) -- hold the morning of your procedure.   ACE/ARBs: Cozaar (losartan) -- hold the morning of your procedure.   Anticoagulants: Do not take apixaban (Eliquis) on the morning of your procedure. --- DO NOT MISS ANY OTHER DOSES ---         Additional Instructions  If you wear CPAP, please bring your mask and machine with you to the hospital.    Take a bath or shower with anti-bacterial soap the evening before, or the morning of the procedure. We will give this to you at your office visit.     Bring photo ID and your health insurance card(s).    Arrange for a driver to take you home from the hospital.    Bring an accurate list of your current medications with you to the hospital (all meds and supplements taken daily).    Wear comfortable clothes and don't bring valuables, other than photo identification card, with you to the hospital.    Please pack a bag for an overnight stay.     Please review your pre-procedure instructions and call the office at [phone] with any questions. You may ask to speak with any of Dr. Adella Hare Dendi's nurses. There are several of [place] in the office that can assist you. For questions regarding post procedure care or restrictions please refer to the Your Care Instructions included in the  Left Atrial Appendage Closure Device Placement (Watchman) Packet.     ALLERGIES  Allergies   Allergen Reactions   ? Sulfa (Sulfonamide Antibiotics) HIVES and RASH       FYI:  There are several follow up appointments throughout the first year after implantation of this device.  I will contact you to schedule throughout the year.  45 day post procedure Office Visit and TEE (transesophageal echocardiogram)  We want to ensure there are no clots or leakage around the implant before anticoagulation is discontinued and Plavix is started.   6 months post procedure Office Visit  Most patients are able to stop the Plavix at this point and be on aspirin only for life.   1 year post procedure Office Visit and TEE (transesophageal echocardiogram)  This is our first chance to see what the implant looks like with no anticoagulation and no plavix.  We want to ensure there are no clots forming at the implant site.     Also, please remember:    You MUST take antibiotics prior to any dental procedures (including cleaning), invasive respiratory tract procedures and invasive skin procedures for 6 months after your device placement.  This will help to prevent infection to the device.  Contact your primary care provider or cardiologist for an antibiotic prescription to be taken prior to any of the above listed procedures.  _________________________________________  Form completed by: Tempie Hoist, BSN  Date completed: 11/01/20  Method: Via MyChart.     Please let me know if you have any questions or concerns.    Thanks so much,  Tempie Hoist, BSN, RN, [facility]- Nurse Navigator   [facility] [place] [facility]  [facility]  Phone: [phone]- [email or web address]  [address] [place], [place], [place] [address]

## 2020-11-03 ENCOUNTER — Encounter: Admit: 2020-11-03 | Discharge: 2020-11-03 | Payer: MEDICARE

## 2020-11-03 DIAGNOSIS — E119 Type 2 diabetes mellitus without complications: Secondary | ICD-10-CM

## 2020-11-03 DIAGNOSIS — R55 Syncope and collapse: Secondary | ICD-10-CM

## 2020-11-03 DIAGNOSIS — E782 Mixed hyperlipidemia: Secondary | ICD-10-CM

## 2020-11-03 DIAGNOSIS — I10 Essential (primary) hypertension: Secondary | ICD-10-CM

## 2020-11-03 DIAGNOSIS — I482 Chronic atrial fibrillation, unspecified: Secondary | ICD-10-CM

## 2020-11-04 ENCOUNTER — Encounter: Admit: 2020-11-04 | Discharge: 2020-11-04 | Payer: MEDICARE

## 2020-11-04 DIAGNOSIS — I2510 Atherosclerotic heart disease of native coronary artery without angina pectoris: Secondary | ICD-10-CM

## 2020-11-04 DIAGNOSIS — I4821 Permanent atrial fibrillation: Secondary | ICD-10-CM

## 2020-11-04 DIAGNOSIS — I10 Essential (primary) hypertension: Secondary | ICD-10-CM

## 2020-11-04 DIAGNOSIS — I350 Nonrheumatic aortic (valve) stenosis: Secondary | ICD-10-CM

## 2020-11-15 ENCOUNTER — Encounter: Admit: 2020-11-15 | Discharge: 2020-11-15 | Payer: MEDICARE

## 2020-11-15 DIAGNOSIS — E669 Obesity, unspecified: Secondary | ICD-10-CM

## 2020-11-15 DIAGNOSIS — I2510 Atherosclerotic heart disease of native coronary artery without angina pectoris: Secondary | ICD-10-CM

## 2020-11-15 DIAGNOSIS — M069 Rheumatoid arthritis, unspecified: Secondary | ICD-10-CM

## 2020-11-15 DIAGNOSIS — M1990 Unspecified osteoarthritis, unspecified site: Secondary | ICD-10-CM

## 2020-11-15 DIAGNOSIS — I10 Essential (primary) hypertension: Secondary | ICD-10-CM

## 2020-11-15 DIAGNOSIS — I4891 Unspecified atrial fibrillation: Secondary | ICD-10-CM

## 2020-11-15 DIAGNOSIS — E785 Hyperlipidemia, unspecified: Secondary | ICD-10-CM

## 2020-11-15 NOTE — Progress Notes
Date of Service: 11/15/2020    Fernando [name] is a 86 y.o. male.       HPI    Mr. Carothers fell while cleaning his driveway drain on Nov 02 2020.  This required 3 staples in the back of his head.  When his injury was being attended to, the physicians noticed that he was dyspneic.  He was actually hospitalized for several days for diuresis for pulmonary vascular congestion.  Therefore, he has now developed symptomatic severe aortic valve stenosis.  Over the past 10 days he has been taking 40 mg of furosemide twice daily which has been very effective in reducing his edema and improving his dyspnea with exertion.  Prior to taking furosemide he would get dyspneic walking 100 feet.  Mr. Wilma Flavin also reports that he is seeing the wound clinic in [place] for chronic venous stasis ulcers on his left pretibial surface.  He reports that the wounds are improving with time.  Mr. Dowland been followed in the past for coronary artery disease,?having undergone coronary bypass surgery in 2007.  He has also been followed for permanent atrial fibrillation.  Most recently he fell in December 2021.  Over the next 2 months he developed abnormalities in balance and then evaluation revealed a subdural hematoma.  He did have a seizure.  He was taken off his aspirin and Eliquis and surgical intervention was not required.  He was placed on Keppra to prevent recurrent seizures which his daughter believes has led to some lethargy.  ?His CHA2DS2-VASc score is 5 for age,?diabetes, peripheral vascular disease?and hypertension.? It appears that he is in the process of being scheduled for evaluation for the Watchman left atrial appendage occlusion device. ?Otherwise,?the patient?reports that he continues to feel well and has no complaints.??The patient reports no angina, palpitations, sensation of sustained forceful heart pounding, lightheadedness or syncope. The patient reports no myalgias, bleeding abnormalities, or new neurologic abnormalities. On May 24, 2016?during a?routine visit,?a routine ECG revealed atrial fibrillation.?At that time he reported no palpitations or sensation of sustained forceful heart pounding.   He did not want to consider cardioversion, antiarrhythmic therapy or arrhythmia ablation and excepted permanent atrial fibrillation.[name] He deferred anticoagulation until November 2021 when he started apixaban.       Vitals:    11/15/20 1057   BP: (!) 140/82   BP Source: Arm, Left Upper   Pulse: 62   SpO2: 98%   O2 Device: None (Room air)   PainSc: Zero   Weight: 119.9 kg (264 lb 6.4 oz)   Height: 188 cm (6' 2)     Body mass index is 33.95 kg/m?[name]     Past Medical History  Patient Active Problem List    Diagnosis Date Noted   ? Subdural hematoma (HCC) 08/15/2020   ? Permanent atrial fibrillation (HCC) 06/29/2020   ? Osteoarthrosis, unspecified whether generalized or localized, lower leg 03/11/2012   ? HTN (hypertension) 03/11/2012   ? Primary localized osteoarthrosis, lower leg 12/18/2011   ? Pain in joint, lower leg 12/18/2011   ? S/P knee replacement 12/18/2011   ? Knee pain 10/25/2011   ? Osteoarthritis of knee 10/25/2011   ? 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 CAD (coronary artery disease) 11/11/2008     status post CABG on 06/18/2005 (patient received a LIMA to      LAD, reverse SVG to OM branch, reverse SVG to mid-RCA and reverse SVG to second      diagonal).  Patient  is angina free and he is a functional Class I.  History of chest pain and admission in [facility] in 11/2007.  Exercise myocardial perfusion imaging study dated 11/13/2007, unremarkable for      ischemia.     ? Obesity 11/11/2008   ? Hyperlipidemia 11/11/2008   ? Hypertension, essential 11/11/2008         Review of Systems   Constitutional: Negative.   HENT: Negative.    Eyes: Negative.    Cardiovascular: Negative.    Respiratory: Negative.    Endocrine: Negative.    Hematologic/Lymphatic: Negative.    Skin: Negative.    Musculoskeletal: Negative. Gastrointestinal: Negative.    Genitourinary: Negative.    Neurological: Negative.    Psychiatric/Behavioral: Negative.    Allergic/Immunologic: Negative.        Physical Exam  GENERAL: The patient is well developed, well nourished, resting comfortably and in no distress.   HEENT: No abnormalities of the visible oro-nasopharynx, conjunctiva or sclera are noted.  NECK: There is no jugular venous distension. Carotids are palpable and without bruits. There is no thyroid enlargement.  Chest: Lung fields are clear to auscultation. There are no wheezes or crackles.  CV: There is an irregular rhythm?with variable intensity of the first heart sound.??A?Grade 3/6 systolic ejection murmur is heard near the right?upper sternal border. ?There are no diastolic murmurs, gallops or rubs. ?His apical heart rate is?72?bpm.  ABD: The abdomen is soft and supple with normal bowel sounds. There is no hepatosplenomegaly, ascites, tenderness, masses or bruits.  Neuro: There are no focal motor defects. Ambulation is normal. Cognitive function is not quite as sharp as it was prior to the subdural hematoma but not bad for an 86 year old man.    Ext:?There?3+?bipedal edema without ?evidence of deep vein thrombosis.???  Chronic venous stasis dermatitis.  Healing left pretibial ulcers.   ?  SKIN:?There are no rashes and no cellulitis  PSYCH:?The patient is calm, rationale and oriented    Cardiovascular Studies  A twelve-lead ECG obtained on 11/15/2020 revealed atrial fibrillation with a heart rate of 72 bpm.  Right bundle branch block is noted.    Labs from 11/04/2020 revealed serum potassium 4.0 mmol/L and serum creatinine 1.53 mg/dL.    Echo Doppler 11/03/2020:  Interpretation Summary    Underlying rhythm is atrial fibrillation at a ventricular rate of 65-80 beats per minute  ?  1. Normal LV size and systolic function.  LVEF is 60 to 65%.  No regional wall motion abnormalities.  2. Moderate concentric hypertrophy.  3. Right ventricle is severely dilated with at least moderately reduced systolic function.  D-shaped septum  4. Mildly dilated left atrium and moderately dilated right atrium  5. Moderately elevated central venous pressure  6. Moderate tricuspid valve regurgitation  7. Calcific aortic valve with moderate to severe stenosis.  Peak velocity is 3.5 m/s and mean gradient is 25 mmHg and dimensionless index is 0.26 .  The aortic valve is heavily calcified and the degree of stenosis may be underestimated by Doppler findings.  No regurgitation  8. Elevated pulmonary artery systolic pressure at 76 mmHg  9. No pericardial effusion  ?  Compared to prior study from January 2022 peak velocity and gradients have increased in current study.  Pulmonary artery systolic pressure is similar    Outside carotid duplex studies from 11/02/2020 revealed right carotid velocities are consistent with less than 50% stenosis.  Left carotid velocities are consistent with 50 to 69% stenosis.    Cardiovascular Health  Factors  Vitals BP Readings from Last 3 Encounters:   11/15/20 (!) 140/82   11/03/20 (!) 167/75   10/21/20 132/79     Wt Readings from Last 3 Encounters:   11/15/20 119.9 kg (264 lb 6.4 oz)   11/03/20 122.5 kg (270 lb)   10/21/20 118.4 kg (261 lb)     BMI Readings from Last 3 Encounters:   11/15/20 33.95 kg/m?   11/03/20 34.67 kg/m?   10/21/20 33.51 kg/m?      Smoking Social History     Tobacco Use   Smoking Status Never Smoker   Smokeless Tobacco Never Used      Lipid Profile Cholesterol   Date Value Ref Range Status   03/30/2020 123  Final     HDL   Date Value Ref Range Status   03/30/2020 46  Final     LDL   Date Value Ref Range Status   03/30/2020 66  Final     Triglycerides   Date Value Ref Range Status   03/30/2020 55  Final      Blood Sugar Hemoglobin A1C   Date Value Ref Range Status   03/30/2020 6.8 (H)  Final     Glucose   Date Value Ref Range Status   10/11/2020 144 (H) 70 - 105 mg/dL Final   29/56/2130 865 (H) 70 - 105 Final   07/30/2020 204 (H) 70 - 105 Final   06/21/2005 129 (H) 70 - 110 MG/DL Final   78/46/9629 95 70 - 110 MG/DL Final   52/84/1324 401 (H) 70 - 110 MG/DL Final     Glucose, POC   Date Value Ref Range Status   06/22/2005 151 (H) 70 - 110 MG/DL Final   02/72/5366 440 (H) 70 - 110 MG/DL Final   34/74/2595 638 (H) 70 - 110 MG/DL Final          Problems Addressed Today  Encounter Diagnoses   Name Primary?   ? Coronary artery disease involving native coronary artery of native heart without angina pectoris Yes   ? Primary hypertension        Assessment and Plan   Currently, Mr. Buhman has well compensated congestive heart failure.  He has been referred for consideration of transcatheter aortic valve replacement.  He has also been referred for implantation of a Watchman left atrial appendage occlusion device.  He does not want to resume aspirin or anticoagulation at this time out of concern for bleeding.  However, he indicated that he would be willing to take anticoagulation for the requisite 45-day period following implantation of the Watchman left atrial appendage occlusion device.  I have asked him to repeat a Chem-7 within the next month to check his renal function and potassium and I have asked him to return for follow-up in 1 month's time to reassess his congestive heart failure. The total time spent during this interview and exam was 30 minutes.         Current Medications (including today's revisions)  ? apixaban (ELIQUIS) 2.5 mg tablet Take one tablet by mouth twice daily. start AFTER you meet with the neurosurgeon   ? furosemide (LASIX) 20 mg tablet Take one tablet by mouth every morning. (Patient taking differently: Take 40 mg by mouth twice daily.)   ? levETIRAcetam XR (KEPPRA XR) 750 mg tablet Take one tablet by mouth daily for 180 days.   ? losartan (COZAAR) 50 mg tablet TAKE 1 TABLET EVERY DAY   ? metoprolol XL (TOPROL  XL) 50 mg extended release tablet TAKE 1 TABLET EVERY DAY   ? nitroglycerin (NITROSTAT) 0.4 mg tablet Place 1 Tab under tongue every 5 minutes as needed for Chest Pain.   ? pioglitazone (ACTOS) 30 mg tablet Take 1 tablet by mouth daily.   ? potassium chloride SR (K-DUR) 20 mEq tablet Take 1 tablet by mouth daily. Take with a meal and a full glass of water.   ? simvastatin (ZOCOR) 20 mg tablet TAKE ONE TABLET BY MOUTH AT BEDTIME

## 2020-11-23 ENCOUNTER — Encounter: Admit: 2020-11-23 | Discharge: 2020-11-23 | Payer: MEDICARE

## 2020-11-24 ENCOUNTER — Encounter: Admit: 2020-11-24 | Discharge: 2020-11-24 | Payer: MEDICARE

## 2020-11-24 ENCOUNTER — Ambulatory Visit: Admit: 2020-11-24 | Discharge: 2020-11-24 | Payer: MEDICARE

## 2020-11-24 DIAGNOSIS — I272 Pulmonary hypertension, unspecified: Secondary | ICD-10-CM

## 2020-11-24 DIAGNOSIS — I2510 Atherosclerotic heart disease of native coronary artery without angina pectoris: Secondary | ICD-10-CM

## 2020-11-24 DIAGNOSIS — I10 Essential (primary) hypertension: Secondary | ICD-10-CM

## 2020-11-24 DIAGNOSIS — Z01818 Encounter for other preprocedural examination: Secondary | ICD-10-CM

## 2020-11-24 DIAGNOSIS — S065X9A Traumatic subdural hemorrhage with loss of consciousness of unspecified duration, initial encounter: Secondary | ICD-10-CM

## 2020-11-24 DIAGNOSIS — Z951 Presence of aortocoronary bypass graft: Secondary | ICD-10-CM

## 2020-11-24 DIAGNOSIS — E782 Mixed hyperlipidemia: Secondary | ICD-10-CM

## 2020-11-24 DIAGNOSIS — I4891 Unspecified atrial fibrillation: Secondary | ICD-10-CM

## 2020-11-24 DIAGNOSIS — I4821 Permanent atrial fibrillation: Secondary | ICD-10-CM

## 2020-11-24 DIAGNOSIS — M1990 Unspecified osteoarthritis, unspecified site: Secondary | ICD-10-CM

## 2020-11-24 DIAGNOSIS — I350 Nonrheumatic aortic (valve) stenosis: Secondary | ICD-10-CM

## 2020-11-24 DIAGNOSIS — E785 Hyperlipidemia, unspecified: Secondary | ICD-10-CM

## 2020-11-24 DIAGNOSIS — I5032 Chronic diastolic (congestive) heart failure: Secondary | ICD-10-CM

## 2020-11-24 DIAGNOSIS — E669 Obesity, unspecified: Secondary | ICD-10-CM

## 2020-11-24 DIAGNOSIS — M069 Rheumatoid arthritis, unspecified: Secondary | ICD-10-CM

## 2020-11-24 LAB — CBC
HEMATOCRIT: 41 % — ABNORMAL HIGH (ref 40–50)
MCH: 32 pg — ABNORMAL HIGH (ref 26–34)
MCHC: 33 g/dL (ref 32.0–36.0)
MCV: 99 FL — ABNORMAL HIGH (ref 80–100)
MPV: 9 FL (ref 7–11)
PLATELET COUNT: 122 K/UL — ABNORMAL LOW (ref 150–400)
RBC COUNT: 4.1 M/UL — ABNORMAL LOW (ref 4.4–5.5)
RDW: 15 % — ABNORMAL HIGH (ref >60)

## 2020-11-24 LAB — BASIC METABOLIC PANEL
ANION GAP: 10 g/dL (ref 3–12)
CHLORIDE: 103 MMOL/L (ref 98–110)
POTASSIUM: 4.5 MMOL/L (ref 3.5–5.1)
SODIUM: 141 MMOL/L (ref 137–147)

## 2020-11-24 MED ORDER — TEMAZEPAM 15 MG PO CAP
15 mg | Freq: Every evening | ORAL | 0 refills | PRN
Start: 2020-11-24 — End: ?

## 2020-11-24 MED ORDER — ALUMINUM-MAGNESIUM HYDROXIDE 200-200 MG/5 ML PO SUSP
30 mL | ORAL | 0 refills | PRN
Start: 2020-11-24 — End: ?

## 2020-11-24 MED ORDER — NITROGLYCERIN 0.4 MG SL SUBL
.4 mg | SUBLINGUAL | 0 refills | PRN
Start: 2020-11-24 — End: ?

## 2020-11-24 MED ORDER — ASPIRIN 325 MG PO TAB
325 mg | Freq: Once | ORAL | 0 refills
Start: 2020-11-24 — End: ?

## 2020-11-24 MED ORDER — DOCUSATE SODIUM 100 MG PO CAP
100 mg | Freq: Every day | ORAL | 0 refills | PRN
Start: 2020-11-24 — End: ?

## 2020-11-24 MED ORDER — ACETAMINOPHEN 325 MG PO TAB
650 mg | ORAL | 0 refills | PRN
Start: 2020-11-24 — End: ?

## 2020-11-24 NOTE — Patient Instructions
CARDIAC CATHETERIZATION   PRE-ADMISSION INSTRUCTIONS    Patient Name: Fernando [name]  MRN#: [id number]  Date of Birth: [birth year] (86 y.o.)  Today's Date: 11/24/2020    PROCEDURE:  You are scheduled for a Coronary Angiogram with possible Angioplasty/Stenting and Right Heart Pressures Evaluation with Dr. Salley Scarlet Hajj.    PROCEDURE DATE AND ARRIVAL TIME:  Your procedure date is 6/22.  You will receive a call from the Cath lab staff between 8:00 a.m. and noon on the business day prior to your procedure to let you know at what time to arrive on the day of your procedure.     Please check in at the Admitting Desk in the [facility] for your procedure. [facility] Entrance and and take a right. Continue down the hallway past the Cardiovascular Medicine office. That hall will take you into the [facility]. Check in at the desk on the left side.)     (If you have further questions regarding your arrival time for the CV lab, please call [phone] by 3:00pm the day before your procedure. Please leave a message with your name and number, your call will be returned in a timely manner.)    PRE-PROCEDURE APPOINTMENTS:    6/16 at 10   Office visit to update history and physical (requirement within 30 days of procedure)  with Dr. Elayne Snare at Cardiovascular [facility]       6/16     Pre-Admission lab work required within 14 days of procedure: BMP and CBC at the [facility] [place] clinic.          FOOD AND DRINK INSTRUCTIONS  Nothing to eat after midnight before your procedure. No caffeine for 24 hours prior to your procedure. You will be under moderate sedation for your procedure.  You may drink clear liquids up to an hour before hospital arrival. This will be confirmed by the Cath lab staff the day before your procedure.     SPECIAL MEDICATION INSTRUCTIONS  Any new prescriptions will be sent to your pharmacy listed on file with [place].       Please either take 4 baby aspirins (4 times 81mg ) or one full strength NON-COATED 325mg  aspirin.   Diuretics: Lasix (furosemide) -- hold the morning of your procedure.        HOLD ALL erectile dysfunction medications for 3 days, unless prescribed for pulmonary hypertension.  HOLD ALL over the counter vitamins or supplements on the morning of your procedure.      Additional Instructions  If you wear CPAP, please bring your mask and machine with you to the hospital.    Take a bath or shower with anti-bacterial soap the evening before, or the morning of the procedure.     Bring photo ID and your health insurance card(s).    Arrange for a driver to take you home from the hospital. Please arrange for a friend or family member to take you home from this test. You cannot take a Taxi, [employer], or public transportation as there has to be a responsible person to help care for you after sedation    Bring an accurate list of your current medications with you to the hospital (all medications and supplements taken daily).  Please use the medication list below and write in the date and time when you took your last dose before your procedure. Update this list of medications as needed.      Wear comfortable clothes and don't bring valuables, other  than photo identification card, with you to the hospital.    Please pack a bag for an overnight stay.     Please review your pre-procedure instructions and bring them with you on the day of your procedure.  Call the valve clinic nurses, [facility], with any questions [phone].      ALLERGIES  Allergies   Allergen Reactions   ? Sulfa (Sulfonamide Antibiotics) HIVES and RASH       CURRENT MEDICATIONS  Outpatient Encounter Medications as of 11/24/2020   Medication Sig Dispense Refill   ? furosemide (LASIX) 20 mg tablet Take one tablet by mouth every morning. (Patient taking differently: Take 40 mg by mouth twice daily.) 90 tablet 3   ? levETIRAcetam XR (KEPPRA XR) 750 mg tablet Take one tablet by mouth daily for 180 days. 90 tablet 1   ? losartan (COZAAR) 50 mg tablet TAKE 1 TABLET EVERY DAY 90 tablet 3   ? metoprolol XL (TOPROL XL) 50 mg extended release tablet TAKE 1 TABLET EVERY DAY 90 tablet 1   ? mupirocin (BACTROBAN) 2 % topical ointment Apply  topically to affected area as Needed.     ? nitroglycerin (NITROSTAT) 0.4 mg tablet Place 1 Tab under tongue every 5 minutes as needed for Chest Pain. 25 Tab 6   ? pioglitazone (ACTOS) 30 mg tablet Take 1 tablet by mouth daily.     ? potassium chloride SR (K-DUR) 20 mEq tablet Take 1 tablet by mouth daily. Take with a meal and a full glass of water.     ? simvastatin (ZOCOR) 20 mg tablet TAKE ONE TABLET BY MOUTH AT BEDTIME 30 Tab 0     Facility-Administered Encounter Medications as of 11/24/2020   Medication Dose Route Frequency Provider Last Rate Last Admin   ? [START ON 03/21/2021] aspirin tablet 325 mg  325 mg Oral ONCE Dendi, Raghuveer, MD           _________________________________________  Form completed by: Lauraine Rinne, RN  Date completed: 11/24/20  Method: In person and given to the patient.          Coronary Angiography  Angiography is a special type of moving X-ray that lets your doctor view your coronary arteries to see if the blood vessels to your heart are narrowed or blocked. This test is done when someone is having a heart attack. Or it may be done if symptoms may mean a heart attack. It also may be done after an abnormal cardiac stress test.   ?Before the procedure   ? Tell your healthcare team what medicines you take and any allergies you may have.  ? Tell your healthcare team if you've had a reaction to contrast dye or have had any kidney problems.  ? Follow any directions you are given for not eating or drinking before surgery.  ? A nurse will place an IV (intravenous) catheter in your vein to give fluids, and medicine to relieve pain and help you feel less anxious.  ? They clean your skin and shave the area where the catheter will be inserted, if needed.    During the procedure ? You will lie on a table with a portable X-ray machine over you. The team will place a surgical drape over your body. The area where the doctor chooses to insert the catheter will be cleaned. This will be either a wrist or the groin.  ? Your doctor will place a long, thin tube (catheter) inside an artery in  your groin or arm and guide it into your heart. You may feel pressure with the insertion of the catheter. A numbing medicine often is injected at the insertion site. This eases discomfort during the procedure.  ? They will inject a contrast dye through the catheter into your blood vessels or heart chambers. You may feel a warm sensation or feeling like you have to urinate when the contrast is injected. This is normal.  ? X-rays are taken to show images of the inside of your heart and coronary arteries.     The catheter can be placed into the groin, arm, or wrist.     After the procedure   ? Your healthcare team will tell you how long to lie down and keep the insertion site still. The amount of time may depend on whether a closure device such as a stitch or collagen plug was used to close the opening made in your artery. The time you must be still may be shorter if one of these devices was used. The amount of time will also depend on if there is any bleeding at the catheter insertion site.  ? If the insertion site was in your groin, you may need to lie down with your leg still for several hours. If the insertion site was in your wrist, a pressure bandage may be put on the site. Or you may have closure device placed on the insertion site. It will be taken off when there is no sign of bleeding. If bleeding occurs, a nurse will put pressure on the area to control it.  ? A nurse will check your blood pressure and the insertion site often. This is to make sure you remain stable after the procedure.  ? You may be asked to drink fluid to help flush the contrast liquid out of your system.  ? Have someone drive you home from the hospital.  ? If your doctor uses angioplasty or a stent to treat a blocked artery, you may stay the night in the hospital. If there are multiple blockages that can't be fixed with a stent or angioplasty, you may need surgery to bypass the blockages. This is called coronary artery bypass graft surgery. Your doctor will explain the results of your test and what treatment options that may be best for you.  ? It?s normal to find a small bruise or lump at the insertion site. The lump may be the collagen plug or stitch that you feel, or a small bruise. These common side effects should disappear within a few weeks.  ? You will be given instructions by your healthcare team on recovering from the coronary angiography. In general, don't lift anything heavier than a gallon of milk for several days. This gives time for the puncture site in the artery wall to heal. Try not to get the puncture site wet. Don't put it under water. Showers are OK. Don't soak in a bathtub, swimming pool, or hot tub until the skin has healed.    When to call your healthcare provider   Call your healthcare provider right away if you have any of these:   ? Symptoms of infection. These include pain, swelling, redness, bleeding, or drainage at the insertion site.  ? Fever of 100.4?F (38?C) or higher, or as advised by your provider  ? Bleeding, bruising, or a lot of swelling where the catheter was inserted  ? Blood in your urine  ? Black or tarry stools  ? Any unusual  bleeding  ? Irregular, very slow, or fast heartbeat  ? Dizziness  Call 911  Call 911if any of these occur:     ? Chest pain  ? Shortness of breath  ? Sudden numbness or weakness in arms, legs, or face, or difficulty speaking  ? The puncture site swells up very fast  ? Bleeding from the puncture site that does not slow down with firm pressure  ? Severe or increasing pain, numbness, coldness, or a bluish color in the leg or arm that held the catheter    [employer] last reviewed this educational content on 07/12/2020    ? 2000-2022 [employer]. All rights reserved. This information is not intended as a substitute for professional medical care. Always follow your healthcare professional's instructions.

## 2020-11-25 ENCOUNTER — Encounter: Admit: 2020-11-25 | Discharge: 2020-11-25 | Payer: MEDICARE

## 2020-11-25 DIAGNOSIS — I350 Nonrheumatic aortic (valve) stenosis: Secondary | ICD-10-CM

## 2020-11-25 NOTE — Progress Notes
Medicare is listed as patient's primary insurance coverage.  Pre-certification is not required for hospitalizations.

## 2020-11-28 ENCOUNTER — Encounter: Admit: 2020-11-28 | Discharge: 2020-11-28 | Payer: MEDICARE

## 2020-11-28 MED ORDER — FUROSEMIDE 20 MG PO TAB
40 mg | ORAL_TABLET | Freq: Every morning | ORAL | 3 refills | 90.00000 days | Status: AC
Start: 2020-11-28 — End: ?

## 2020-11-28 NOTE — Telephone Encounter
Left VM for patient with recommendations from D. Dareen Piano, APRN. Valve clinic nurses' direct phone number [phone]) provided.

## 2020-11-28 NOTE — Telephone Encounter
Spoke with patient's daughter, Selena Batten, regarding decreasing patient's Lasix. Kim communicates understanding.    Inquired about patient's dental status. Selena Batten was unsure of whether or not patient has been seeing a dentist routinely. Notified Kim that patient would need dental clearance prior to TAVR. Kim communicates understanding.

## 2020-11-30 ENCOUNTER — Encounter: Admit: 2020-11-30 | Discharge: 2020-11-30 | Payer: MEDICARE

## 2020-11-30 DIAGNOSIS — I350 Nonrheumatic aortic (valve) stenosis: Secondary | ICD-10-CM

## 2020-11-30 DIAGNOSIS — I2510 Atherosclerotic heart disease of native coronary artery without angina pectoris: Secondary | ICD-10-CM

## 2020-11-30 DIAGNOSIS — I10 Essential (primary) hypertension: Secondary | ICD-10-CM

## 2020-11-30 DIAGNOSIS — I4891 Unspecified atrial fibrillation: Secondary | ICD-10-CM

## 2020-11-30 DIAGNOSIS — E785 Hyperlipidemia, unspecified: Secondary | ICD-10-CM

## 2020-11-30 DIAGNOSIS — M069 Rheumatoid arthritis, unspecified: Secondary | ICD-10-CM

## 2020-11-30 DIAGNOSIS — E669 Obesity, unspecified: Secondary | ICD-10-CM

## 2020-11-30 DIAGNOSIS — M1990 Unspecified osteoarthritis, unspecified site: Secondary | ICD-10-CM

## 2020-11-30 MED ADMIN — HYDRALAZINE 20 MG/ML IJ SOLN [3697]: 10 mg | INTRAVENOUS | @ 18:00:00 | Stop: 2020-11-30 | NDC 67457029100

## 2020-11-30 MED ADMIN — HYDRALAZINE 20 MG/ML IJ SOLN [3697]: 10 mg | INTRAVENOUS | @ 20:00:00 | Stop: 2020-11-30 | NDC 67457029100

## 2020-11-30 MED ADMIN — SODIUM CHLORIDE 0.9 % IV SOLP [27838]: 250 mL | INTRAVENOUS | @ 15:00:00 | Stop: 2020-11-30 | NDC 00338004902

## 2020-11-30 NOTE — Progress Notes
[  11:47 AM] [facility]  [facility]ewitt Willetts. Recommending he gets CTAs in about a week after repeating bMp. Can you help arrange?    [1:24 PM] [employer]. No obstructive disease needing intervention. Does have elevated pressures and would benefit from more diuretics but he recently had the AKI and diuretics were lowered then today he got contrast and in 1 week will get more contrast for his CTAs. He sees [facility] on 7/19 - im not sure who should take claim on him for diuresis but he should maybe be started on high lasix does after the CTAs. Can you help coordinate a follow up visit about 1 week after the CTAs ?

## 2020-12-01 ENCOUNTER — Encounter: Admit: 2020-12-01 | Discharge: 2020-12-01 | Payer: MEDICARE

## 2020-12-01 NOTE — Progress Notes
BMP orders faxed to [name] at [phone].

## 2020-12-05 ENCOUNTER — Encounter: Admit: 2020-12-05 | Discharge: 2020-12-05 | Payer: MEDICARE

## 2020-12-05 DIAGNOSIS — I350 Nonrheumatic aortic (valve) stenosis: Secondary | ICD-10-CM

## 2020-12-05 LAB — BASIC METABOLIC PANEL
CALCIUM: 9.2
CO2: 24 (ref 3–12)
CREATININE: 1.5
GFR ESTIMATED: 47
POTASSIUM: 4.4 MMOL/L (ref 21–30)
SODIUM: 138 FL (ref 7–11)

## 2020-12-06 ENCOUNTER — Encounter: Admit: 2020-12-06 | Discharge: 2020-12-06 | Payer: MEDICARE

## 2020-12-06 DIAGNOSIS — I350 Nonrheumatic aortic (valve) stenosis: Secondary | ICD-10-CM

## 2020-12-06 MED ORDER — FUROSEMIDE 20 MG PO TAB
ORAL_TABLET | Freq: Every day | ORAL | 3 refills | 90.00000 days | Status: AC | PRN
Start: 2020-12-06 — End: ?

## 2020-12-06 NOTE — Telephone Encounter
Reviewed patient's lab results with J. McClymont, APRN. She recommends that we postpone patient's CTA, check a BMP in 1 week & have patient take Lasix PRN only. Called patient's daughter, Selena Batten, & gave her recommendations from J. McClymont, APRN. Kim communicates understanding & agreeable to plan. Order for [facility] faxed to [name] [phone]) per Kim's request. Provided valve clinic nurses' direct phone number.

## 2020-12-15 ENCOUNTER — Encounter: Admit: 2020-12-15 | Discharge: 2020-12-15 | Payer: MEDICARE

## 2020-12-15 DIAGNOSIS — I350 Nonrheumatic aortic (valve) stenosis: Secondary | ICD-10-CM

## 2020-12-16 ENCOUNTER — Encounter: Admit: 2020-12-16 | Discharge: 2020-12-16 | Payer: MEDICARE

## 2020-12-16 ENCOUNTER — Ambulatory Visit: Admit: 2020-12-16 | Discharge: 2020-12-16 | Payer: MEDICARE

## 2020-12-16 DIAGNOSIS — I350 Nonrheumatic aortic (valve) stenosis: Secondary | ICD-10-CM

## 2020-12-16 MED ORDER — SODIUM CHLORIDE 0.9 % IJ SOLN
50 mL | Freq: Once | INTRAVENOUS | 0 refills | Status: CP
Start: 2020-12-16 — End: ?
  Administered 2020-12-16: 19:00:00 50 mL via INTRAVENOUS

## 2020-12-16 MED ORDER — IOHEXOL 350 MG IODINE/ML IV SOLN
100 mL | Freq: Once | INTRAVENOUS | 0 refills | Status: CP
Start: 2020-12-16 — End: ?
  Administered 2020-12-16: 19:00:00 100 mL via INTRAVENOUS

## 2020-12-22 ENCOUNTER — Encounter: Admit: 2020-12-22 | Discharge: 2020-12-22 | Payer: MEDICARE

## 2020-12-22 NOTE — Progress Notes
Letter of dental clearance received and sent to medical records to be scanned to patient's chart.

## 2020-12-23 ENCOUNTER — Encounter: Admit: 2020-12-23 | Discharge: 2020-12-23 | Payer: MEDICARE

## 2020-12-23 ENCOUNTER — Inpatient Hospital Stay: Admit: 2020-12-23 | Discharge: 2020-12-23 | Payer: MEDICARE

## 2020-12-23 DIAGNOSIS — I350 Nonrheumatic aortic (valve) stenosis: Secondary | ICD-10-CM

## 2020-12-27 ENCOUNTER — Encounter: Admit: 2020-12-27 | Discharge: 2020-12-27 | Payer: MEDICARE

## 2020-12-27 DIAGNOSIS — I2510 Atherosclerotic heart disease of native coronary artery without angina pectoris: Secondary | ICD-10-CM

## 2020-12-27 DIAGNOSIS — M069 Rheumatoid arthritis, unspecified: Secondary | ICD-10-CM

## 2020-12-27 DIAGNOSIS — I10 Essential (primary) hypertension: Secondary | ICD-10-CM

## 2020-12-27 DIAGNOSIS — E669 Obesity, unspecified: Secondary | ICD-10-CM

## 2020-12-27 DIAGNOSIS — M1990 Unspecified osteoarthritis, unspecified site: Secondary | ICD-10-CM

## 2020-12-27 DIAGNOSIS — E782 Mixed hyperlipidemia: Secondary | ICD-10-CM

## 2020-12-27 DIAGNOSIS — Z951 Presence of aortocoronary bypass graft: Secondary | ICD-10-CM

## 2020-12-27 DIAGNOSIS — E785 Hyperlipidemia, unspecified: Secondary | ICD-10-CM

## 2020-12-27 DIAGNOSIS — I4891 Unspecified atrial fibrillation: Secondary | ICD-10-CM

## 2020-12-27 MED ORDER — POTASSIUM CHLORIDE 10 MEQ PO TBER
10 meq | ORAL_TABLET | Freq: Every day | ORAL | 3 refills | 30.00000 days | Status: AC
Start: 2020-12-27 — End: ?

## 2020-12-27 MED ORDER — FUROSEMIDE 40 MG PO TAB
40 mg | ORAL_TABLET | Freq: Every morning | ORAL | 1 refills | 90.00000 days | Status: AC
Start: 2020-12-27 — End: ?

## 2020-12-27 NOTE — Progress Notes
Date of Service: 12/27/2020    Fernando [name] is a 86 y.o. male.       HPI    Fernando [name] has been followed for coronary disease and aortic valve stenosis.  He has developed symptomatic severe aortic valve stenosis and he reports that he has been scheduled for transcatheter aortic valve replacement on January 12, 2021.  He has developed progressive dyspnea with exertion over the past year and becomes dyspneic walking a block.   He denies dyspnea at rest or nocturnal dyspnea.  Coronary angiography was performed on 11/30/2020 which revealed severe pulmonary hypertension and patent coronary bypass grafts.  His furosemide was held at the time out of concern for worsening renal function associated with contrast.  His renal function has been stable since his furosemide was held but he has developed worsening lower extremity edema. Fernando [name] reports that he is seeing the wound clinic in [place] for chronic venous stasis ulcers on his left pretibial surface.  He reports 4 small wounds on his left pretibial surface and none currently on his right pretibial surface. Fernando [name] been followed in the past for coronary artery disease,?having undergone coronary bypass surgery in 2007.??He has also been followed for permanent atrial fibrillation. ???His CHA2DS2-VASc score is?5?for age,?diabetes, peripheral vascular disease?and hypertension.? ?Otherwise,?the patient?reports that he continues to feel well and has no complaints.??The patient reports no angina, palpitations, sensation of sustained forceful heart pounding, lightheadedness or syncope. The patient reports no myalgias, bleeding abnormalities,?or new neurologic abnormalities. On May 24, 2016?during a?routine visit,?a routine ECG revealed atrial fibrillation.?At that time he reported no palpitations or sensation of sustained forceful heart pounding.?  He did not want to consider cardioversion, antiarrhythmic therapy or arrhythmia ablation and accepted permanent atrial fibrillation.Fernando [name]?He deferred anticoagulation until November 2021 when he started apixaban. He fell in December 2021. ?Over the next 2 months he developed abnormalities in?balance and then evaluation revealed a subdural hematoma. ?He did have a seizure. ?He was taken off his aspirin and Eliquis and surgical intervention was not required. ?He was placed on Keppra to prevent recurrent seizures which his daughter believes has led to some lethargy.? [name] fell while cleaning his driveway drain on Nov 02 2020.  This required 3 staples in the back of his head.       Vitals:    12/27/20 1027   BP: 124/76   BP Source: Arm, Left Upper   Pulse: 77   SpO2: 95%   O2 Device: None (Room air)   PainSc: Zero   Weight: 125.6 kg (277 lb)   Height: 188 cm (6' 2)     Body mass index is 35.56 kg/m?Fernando [name]     Past Medical History  Patient Active Problem List    Diagnosis Date Noted   ? Nonrheumatic aortic valve stenosis 11/24/2020   ? Pulmonary HTN (HCC) 11/24/2020   ? Chronic diastolic heart failure (HCC) 11/24/2020   ? Subdural hematoma (HCC) 08/15/2020   ? Permanent atrial fibrillation (HCC) 06/29/2020   ? Osteoarthrosis, unspecified whether generalized or localized, lower leg 03/11/2012   ? Primary localized osteoarthrosis, lower leg 12/18/2011   ? Pain in joint, lower leg 12/18/2011   ? S/P knee replacement 12/18/2011   ? Knee pain 10/25/2011   ? Osteoarthritis of knee 10/25/2011   ? 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 CAD (coronary artery disease) 11/11/2008     status post CABG on 06/18/2005 (patient received a LIMA to      LAD,  reverse SVG to OM branch, reverse SVG to mid-RCA and reverse SVG to second      diagonal).  Patient is angina free and he is a functional Class I.  History of chest pain and admission in [facility] in 11/2007.  Exercise myocardial perfusion imaging study dated 11/13/2007, unremarkable for      ischemia.     ? Obesity 11/11/2008   ? Hyperlipidemia 11/11/2008   ? Hypertension, essential 11/11/2008         Review of Systems   Constitutional: Positive for weight gain.   HENT: Negative.    Eyes: Negative.    Cardiovascular: Positive for leg swelling.   Respiratory: Negative.    Endocrine: Negative.    Hematologic/Lymphatic: Negative.    Skin: Negative.    Musculoskeletal: Negative.    Gastrointestinal: Negative.    Genitourinary: Negative.    Neurological: Negative.    Psychiatric/Behavioral: Negative.    Allergic/Immunologic: Negative.        Physical Exam  GENERAL: The patient is well developed, well nourished, resting comfortably and in no distress.   HEENT: No abnormalities of the visible oro-nasopharynx, conjunctiva or sclera are noted.  NECK: There is no jugular venous distension. Carotids are palpable and without bruits. There is no thyroid enlargement.  Chest: Lung fields are clear to auscultation. There are no wheezes or crackles.  CV: There is an irregular rhythm?with variable intensity of the first heart sound.??A?Grade 3/6 systolic ejection murmur is heard near the right?upper sternal border. ?There are no diastolic murmurs, gallops or rubs. ?His apical heart rate is?72?bpm.  ABD: The abdomen is soft and supple with normal bowel sounds. There is no hepatosplenomegaly, ascites, tenderness, masses or bruits.  Neuro: There are no focal motor defects. Ambulation is normal. Cognitive function?is not quite as sharp as it was prior to the subdural hematoma but not bad for an 86 year old man.?  ?Ext:?There?3+?bipedal edema without ?evidence of deep vein thrombosis.?????Chronic venous stasis dermatitis. Left pretibial ulcers being followed by the wound clinic. ???  SKIN:?There are no rashes and no cellulitis  PSYCH:?The patient is calm, rationale and oriented    Cardiovascular Studies  A twelve-lead ECG obtained on November 15, 2020 reveals atrial fibrillation with a heart rate of 72 bpm.  Right bundle branch block is noted.    Echo Doppler 11/03/2020:  Interpretation Summary    Underlying rhythm is atrial fibrillation at a ventricular rate of 65-80 beats per minute  ?  1. Normal LV size and systolic function.  LVEF is 60 to 65%.  No regional wall motion abnormalities.  2. Moderate concentric hypertrophy.  3. Right ventricle is severely dilated with at least moderately reduced systolic function.  D-shaped septum  4. Mildly dilated left atrium and moderately dilated right atrium  5. Moderately elevated central venous pressure  6. Moderate tricuspid valve regurgitation  7. Calcific aortic valve with moderate to severe stenosis.  Peak velocity is 3.5 m/s and mean gradient is 25 mmHg and dimensionless index is 0.26 .  The aortic valve is heavily calcified and the degree of stenosis may be underestimated by Doppler findings.  No regurgitation  8. Elevated pulmonary artery systolic pressure at 76 mmHg  9. No pericardial effusion  ?  Compared to prior study from January 2022 peak velocity and gradients have increased in current study.  Pulmonary artery systolic pressure is similar    Right and left heart catheterization with coronary angiography 11/30/2020:  FINDINGS:  HEMODYNAMICS:  1. RA pressure 17 mmHg with  a V-wave of 30 mmHg.  2. RV pressure 90/12 mmHg with RVEDP of 20 mmHg.  3. PA pressure 90/33 mmHg with a mean PA of 52 mmHg.  4. Pulmonary capillary wedge pressure of 25 mmHg with a V-wave of 27 mmHg.  5. PA sat 70%, RA sat 70%, arterial sat 98%.  6. Hemoglobin 13.7, BSA 2.41, blood pressure 220/116 mmHg, heart rate 61 beats per minute.  7. Cardiac output 5.67 L/minute by Fick and 4.5 L/minute by thermo.  8. Cardiac index 2.35 L/minute per square meter by Fick and 1.86 L/minute per square meter by thermo.   ?  CORONARY ANGIOGRAPHY:  1. Coronary anatomy was right dominant.  2. The left main coronary artery has mild disease.  3. The LAD is 100% occluded in its proximal to midportion.  4. The circumflex artery has severe 90% stenosis in its proximal to midportion.  The marginal artery fills via competitive flow from patent SVG graft.  5. The right coronary artery is subtotally occluded in its midportion with 95% stenosis.  There is competitive flow from a patent SVG graft to the mid and distal RCA.   ?  SVG GRAFT AND LIMA GRAFT ANGIOGRAPHY:  1. LIMA to the LAD is widely patent with no significant disease distal to insertion.  LAD has mild disease.  2. SVG graft to diagonal artery is patent with no significant disease.  The diagonal artery distal to SVG graft is relatively small vessel with no significant disease.  3. SVG graft to marginal artery is patent with 50% to 60% stenosis in its proximal portion.  The marginal artery distal to insertion of graft has no significant disease.  4. SVG graft to mid RCA is widely patent with no significant disease.  RCA distal to insertion of the graft as well as RPDA and RPLV have minimal disease.  5. RFR of the SVG graft to marginal artery was performed.  A JR4 guide catheter was used to engage the graft and RFR wire was advanced across the lesion.  RFR was 0.99, which indicates no evidence of hemodynamically significant stenosis in that graft.  ?  CONCLUSIONS:    1. Elevated right and left-sided filling pressures.  2. Severely elevated pulmonary artery pressures.  3. Normal cardiac output and cardiac index.  4. Severe 3-vessel obstructive native coronary artery disease.  5. Widely patent grafts, including left internal mammary artery to the left anterior descending, saphenous vein graft to diagonal artery, saphenous vein graft to marginal artery, and saphenous vein graft to right coronary artery; with negative resting full-cycle ratio of moderate disease in saphenous vein graft to marginal artery as described above.      Cardiovascular Health Factors  Vitals BP Readings from Last 3 Encounters:   12/27/20 124/76   11/30/20 (!) 176/113   11/30/20 (!) 154/74     Wt Readings from Last 3 Encounters:   12/27/20 125.6 kg (277 lb)   11/30/20 116.1 kg (256 lb)   11/30/20 116.2 kg (256 lb 1.6 oz)     BMI Readings from Last 3 Encounters:   12/27/20 35.56 kg/m?   11/30/20 32.87 kg/m?   11/30/20 32.88 kg/m?      Smoking Social History     Tobacco Use   Smoking Status Never Smoker   Smokeless Tobacco Never Used      Lipid Profile Cholesterol   Date Value Ref Range Status   03/30/2020 123  Final     HDL   Date Value Ref Range  Status   03/30/2020 46  Final     LDL   Date Value Ref Range Status   03/30/2020 66  Final     Triglycerides   Date Value Ref Range Status   03/30/2020 55  Final      Blood Sugar Hemoglobin A1C   Date Value Ref Range Status   03/30/2020 6.8 (H)  Final     Glucose   Date Value Ref Range Status   12/13/2020 154 (H) 70 - 105 Final   12/05/2020 147  Final   11/30/2020 142 (H) 70 - 100 MG/DL Final   45/40/9811 914 (H) 70 - 110 MG/DL Final   78/29/5621 95 70 - 110 MG/DL Final   30/86/5784 696 (H) 70 - 110 MG/DL Final     Glucose, POC   Date Value Ref Range Status   11/30/2020 153 (H) 70 - 100 MG/DL Final   29/52/8413 244 (H) 70 - 110 MG/DL Final   06/13/7251 664 (H) 70 - 110 MG/DL Final          Problems Addressed Today  Encounter Diagnoses   Name Primary?   ? Coronary artery disease involving native coronary artery of native heart without angina pectoris Yes   ? Mixed hyperlipidemia    ? Hypertension, essential    ? S/P CABG (coronary artery bypass graft)        Assessment and Plan   Mr. Villagran has severe symptomatic aortic valve stenosis and he reports that he has been scheduled for transcatheter aortic valve replacement.  He has developed progressive dyspnea with exertion and reduced exercise tolerance over the past year.  It appears that his diuretic was held at the time of heart catheterization to prevent contrast-induced nephropathy.  He has developed worsening edema without his diuretic.  Prior to heart catheterization he was taken furosemide 40 mg daily, although he would increase the dose to 40 mg twice daily, as necessary.  He wants to restart furosemide 40 mg daily with potassium supplementation 10 mill equivalents daily.  If this does not completely control his edema he wants to increase the dose to 40 mg twice daily with 20 mill equivalents of potassium supplementation daily.  I have asked him to repeat his Chem-7 in 1 week.  He reports that he is being followed in the wound care clinic in [place] for his left pretibial leg ulcers associated with chronic venous stasis and hopefully a reduction his edema will help with wound healing.  Review of the operative report when he had coronary bypass grafting on June 18, 2005 indicates that he underwent ligation of left atrial appendage at that time.  At some time in the future it may be beneficial to repeat his transesophageal echo to verify that his left atrial appendage is no longer patent.  I have asked him to return for follow-up in 3 months time. The total time spent during this interview and exam was 30 minutes.         Current Medications (including today's revisions)  ? aspirin EC (ASPIR-LOW) 81 mg tablet Take one tablet by mouth daily. Take with food.   ? furosemide (LASIX) 40 mg tablet Take one tablet by mouth every morning.   ? levETIRAcetam XR (KEPPRA XR) 750 mg tablet Take one tablet by mouth daily for 180 days.   ? losartan (COZAAR) 50 mg tablet TAKE 1 TABLET EVERY DAY (Patient taking differently: Take 50 mg by mouth daily.)   ? metoprolol XL (TOPROL XL) 50  mg extended release tablet TAKE 1 TABLET EVERY DAY   ? mupirocin (BACTROBAN) 2 % topical ointment Apply  topically to affected area as Needed.   ? nitroglycerin (NITROSTAT) 0.4 mg tablet Place 1 Tab under tongue every 5 minutes as needed for Chest Pain.   ? pioglitazone (ACTOS) 30 mg tablet Take 1 tablet by mouth daily.   ? potassium chloride (K-TAB) 10 mEq tablet Take one tablet by mouth daily. Take with a meal and a full glass of water.   ? simvastatin (ZOCOR) 20 mg tablet TAKE ONE TABLET BY MOUTH AT BEDTIME (Patient taking differently: Take 20 mg by mouth daily.)

## 2020-12-29 ENCOUNTER — Encounter: Admit: 2020-12-29 | Discharge: 2020-12-29 | Payer: MEDICARE

## 2020-12-29 DIAGNOSIS — E782 Mixed hyperlipidemia: Secondary | ICD-10-CM

## 2020-12-29 DIAGNOSIS — I2510 Atherosclerotic heart disease of native coronary artery without angina pectoris: Secondary | ICD-10-CM

## 2020-12-29 DIAGNOSIS — E119 Type 2 diabetes mellitus without complications: Secondary | ICD-10-CM

## 2020-12-29 DIAGNOSIS — I5032 Chronic diastolic (congestive) heart failure: Secondary | ICD-10-CM

## 2020-12-29 DIAGNOSIS — I10 Essential (primary) hypertension: Secondary | ICD-10-CM

## 2020-12-29 DIAGNOSIS — Z951 Presence of aortocoronary bypass graft: Secondary | ICD-10-CM

## 2020-12-29 LAB — BASIC METABOLIC PANEL
ANION GAP: 14
BLD UREA NITROGEN: 26 — ABNORMAL HIGH (ref 8.4–25.7)
CALCIUM: 9.3
CHLORIDE: 108 — ABNORMAL HIGH (ref 98–107)
CO2: 21 — ABNORMAL LOW (ref 23–31)
CREATININE: 1.3 — ABNORMAL HIGH (ref 0.72–1.25)
GFR ESTIMATED: 53 — ABNORMAL LOW (ref >59)
GLUCOSE,PANEL: 152 — ABNORMAL HIGH (ref 70–105)
POTASSIUM: 4.6
SODIUM: 138

## 2020-12-30 ENCOUNTER — Encounter: Admit: 2020-12-30 | Discharge: 2020-12-30 | Payer: MEDICARE

## 2020-12-30 DIAGNOSIS — I2510 Atherosclerotic heart disease of native coronary artery without angina pectoris: Secondary | ICD-10-CM

## 2020-12-30 DIAGNOSIS — I350 Nonrheumatic aortic (valve) stenosis: Secondary | ICD-10-CM

## 2020-12-30 MED ORDER — LIDOCAINE (PF) 10 MG/ML (1 %) IJ SOLN
.2 mL | INTRAMUSCULAR | 0 refills | PRN
Start: 2020-12-30 — End: ?

## 2020-12-30 MED ORDER — SODIUM CHLORIDE 0.9 % IV SOLP
INTRAVENOUS | 0 refills
Start: 2020-12-30 — End: ?

## 2020-12-30 MED ORDER — ASPIRIN 81 MG PO CHEW
81 mg | Freq: Every day | ORAL | 0 refills
Start: 2020-12-30 — End: ?

## 2021-01-02 ENCOUNTER — Encounter: Admit: 2021-01-02 | Discharge: 2021-01-02 | Payer: MEDICARE

## 2021-01-02 NOTE — Telephone Encounter
-----   Message from Hester Mates, MD sent at 12/30/2020  4:29 PM CDT -----  To all: I do not see any major changes here.  His serum creatinine is a little better.  His serum glucose remains in his usual range at approximately 150 mg/dL thanks.  SBG  ----- Message -----  From: Floy Sabina, RN  Sent: 12/29/2020  12:30 PM CDT  To: Hester Mates, MD    Labs for your review. Saw pt in office 7/19, increased lasix to 40mg  daily with potassium . Recheck labs in 1 week (labs obtained early, unsure as to why.) Pt contacted to have redrawn next week per your recommendations.

## 2021-01-03 ENCOUNTER — Encounter: Admit: 2021-01-03 | Discharge: 2021-01-03 | Payer: MEDICARE

## 2021-01-03 NOTE — Progress Notes
Request for the following medical records for purpose of continuity of care:        Fernando [name]   DOB: Dec 29, 1934      Please fax records to Cardiovascular Medicine [facility] [phone]    Request records:    Office Notes with medication changes/ labs from Dr. Andreas Newport    Other      Thank you,  Floy Sabina, RN    Cardiovascular Medicine  [facility]  [address]  [place], [place] [address]  Phone:  [phone]  Fax:  [phone]

## 2021-01-04 ENCOUNTER — Encounter: Admit: 2021-01-04 | Discharge: 2021-01-04 | Payer: MEDICARE

## 2021-01-04 DIAGNOSIS — E119 Type 2 diabetes mellitus without complications: Secondary | ICD-10-CM

## 2021-01-04 DIAGNOSIS — I5032 Chronic diastolic (congestive) heart failure: Secondary | ICD-10-CM

## 2021-01-04 DIAGNOSIS — I2510 Atherosclerotic heart disease of native coronary artery without angina pectoris: Secondary | ICD-10-CM

## 2021-01-04 DIAGNOSIS — I10 Essential (primary) hypertension: Secondary | ICD-10-CM

## 2021-01-04 LAB — BASIC METABOLIC PANEL
BLD UREA NITROGEN: 29 — ABNORMAL HIGH (ref 8.4–25.7)
CALCIUM: 9.5
CHLORIDE: 107
CO2: 24
CREATININE: 1.3 — ABNORMAL HIGH (ref 0.72–1.25)
GLUCOSE,PANEL: 149 — ABNORMAL HIGH (ref 70–105)
POTASSIUM: 4.3
SODIUM: 138

## 2021-01-04 NOTE — Telephone Encounter
-----   Message from Steven B Gollub, MD sent at 12/30/2020  4:29 PM CDT -----  To all: I do not see any major changes here.  His serum creatinine is a little better.  His serum glucose remains in his usual range at approximately 150 mg/dL thanks.  SBG  ----- Message -----  From: Bosch, Megan, RN  Sent: 12/29/2020  12:30 PM CDT  To: Steven B Gollub, MD    Labs for your review. Saw pt in office 7/19, increased lasix to 40mg daily with potassium 10mEq. Recheck labs in 1 week (labs obtained early, unsure as to why.) Pt contacted to have redrawn next week per your recommendations.

## 2021-01-04 NOTE — Telephone Encounter
Attempted to call results.  No answer no mail box.

## 2021-01-05 ENCOUNTER — Encounter: Admit: 2021-01-05 | Discharge: 2021-01-05 | Payer: MEDICARE

## 2021-01-05 NOTE — Telephone Encounter
Called in Response to pharmacy fax stating 750 mg levetiracetam XR one tab daily is correct script. They will send to patient.

## 2021-01-10 ENCOUNTER — Ambulatory Visit: Admit: 2021-01-10 | Discharge: 2021-01-10 | Payer: MEDICARE

## 2021-01-10 ENCOUNTER — Encounter: Admit: 2021-01-10 | Discharge: 2021-01-10 | Payer: MEDICARE

## 2021-01-10 DIAGNOSIS — M1990 Unspecified osteoarthritis, unspecified site: Secondary | ICD-10-CM

## 2021-01-10 DIAGNOSIS — I2510 Atherosclerotic heart disease of native coronary artery without angina pectoris: Secondary | ICD-10-CM

## 2021-01-10 DIAGNOSIS — I10 Essential (primary) hypertension: Secondary | ICD-10-CM

## 2021-01-10 DIAGNOSIS — E669 Obesity, unspecified: Secondary | ICD-10-CM

## 2021-01-10 DIAGNOSIS — I350 Nonrheumatic aortic (valve) stenosis: Secondary | ICD-10-CM

## 2021-01-10 DIAGNOSIS — I4891 Unspecified atrial fibrillation: Secondary | ICD-10-CM

## 2021-01-10 DIAGNOSIS — E119 Type 2 diabetes mellitus without complications: Secondary | ICD-10-CM

## 2021-01-10 DIAGNOSIS — E785 Hyperlipidemia, unspecified: Secondary | ICD-10-CM

## 2021-01-10 DIAGNOSIS — N1831 Stage 3a chronic kidney disease (HCC): Secondary | ICD-10-CM

## 2021-01-10 DIAGNOSIS — I5032 Chronic diastolic (congestive) heart failure: Secondary | ICD-10-CM

## 2021-01-10 DIAGNOSIS — E782 Mixed hyperlipidemia: Secondary | ICD-10-CM

## 2021-01-10 DIAGNOSIS — Z951 Presence of aortocoronary bypass graft: Secondary | ICD-10-CM

## 2021-01-10 DIAGNOSIS — R569 Unspecified convulsions: Secondary | ICD-10-CM

## 2021-01-10 DIAGNOSIS — I4821 Permanent atrial fibrillation: Secondary | ICD-10-CM

## 2021-01-10 LAB — COMPREHENSIVE METABOLIC PANEL
ALBUMIN: 4.3 g/dL (ref 3.5–5.0)
ALK PHOSPHATASE: 148 U/L — ABNORMAL HIGH (ref 25–110)
ALT: 17 U/L (ref 7–56)
ANION GAP: 7 (ref 3–12)
AST: 17 U/L (ref 7–40)
BLD UREA NITROGEN: 35 mg/dL — ABNORMAL HIGH (ref 7–25)
CALCIUM: 9.3 mg/dL — ABNORMAL LOW (ref 8.5–10.6)
CHLORIDE: 105 MMOL/L — ABNORMAL HIGH (ref 98–110)
CO2: 29 MMOL/L (ref 21–30)
CREATININE: 1.5 mg/dL — ABNORMAL HIGH (ref 0.4–1.24)
EGFR: 45 mL/min — ABNORMAL LOW (ref >60)
POTASSIUM: 5.1 MMOL/L (ref 3.5–5.1)
SODIUM: 141 MMOL/L (ref 137–147)
TOTAL BILIRUBIN: 1.1 mg/dL (ref 0.3–1.2)
TOTAL PROTEIN: 7 g/dL (ref 6.0–8.0)

## 2021-01-10 LAB — URINALYSIS DIPSTICK REFLEX TO CULTURE
NITRITE: NEGATIVE K/UL — ABNORMAL LOW (ref 4.5–11.0)
URINE BILE: NEGATIVE
URINE BLOOD: NEGATIVE mg/dL (ref 4.0–8.0)
URINE KETONE: NEGATIVE
URINE PH: 6 (ref 5.0–8.0)
URINE SPEC GRAVITY: 1 /HPF (ref 1.005–1.030)

## 2021-01-10 LAB — URINALYSIS MICROSCOPIC REFLEX TO CULTURE

## 2021-01-10 LAB — BNP (B-TYPE NATRIURETIC PEPTI): BNP: 133 pg/mL — ABNORMAL HIGH (ref 0–100)

## 2021-01-10 LAB — CBC
MCH: 34 pg — ABNORMAL HIGH (ref 26–34)
RBC COUNT: 3.9 M/UL — ABNORMAL LOW (ref 4.4–5.5)
WBC COUNT: 5.5 K/UL (ref 4.5–11.0)

## 2021-01-10 NOTE — Patient Education
Pre-op education appt complete with Horatio and his dtr, Selena Batten; who will be w/ pt on DOS also.  Informed of current visitor policy.  Reviewed and provided written pre-op instructions.  Instructed pt to be NPO @ 2300 the night before surgery, except for a sip of water to take any meds that the [facility] pharmacist instructs pt to take on the morning of surgery.  Instructed to cont taking ASA as prescribed, including on the morning of surgery and verified that he is currently off the Eliquis. Instructed to take CHG 4% shower X 2 prior to coming to hospital as per written instructions; soap provided to pt.  Instructed to check in at [facility] Admissions @ 0815 on DOS.  Discussed process for pre-op, intra-op and post-op recovery.  Discussed post-op pain; lifting and driving restrictions; importance of mobilization and s/s of infection after discharge.  Instructed to call the CTS office if having any health changes prior to surgery.  Pt verbalized understanding of all instructions.  Denies skin or dental issues; denies s/s UTI. KCCQ complete - see flowsheet.  24M walks complete - see additional note.  Pt denies s/s covid. Preop labs drawn in CTS Lab.   Escorted pt to radiology for CXR.  All questions answered to the pt's satisfaction.  Escorted to [facility] Admissions for Pre-registration and then to [facility].   kz

## 2021-01-10 NOTE — Progress Notes
5 Meter walks complete:     1st Trial = 8.15 seconds  2nd Trial = 7.62 seconds  3rd Trial = 7.62 seconds

## 2021-01-10 NOTE — Progress Notes
Date of Service: 01/10/2021       Subjective:             Fernando [name] is a 86 y.o. male.      History of Present Illness  We had the pleasure of seeing Fernando [name] who was referred for aortic stenosis. He was evaluated by Dr. Mackey Birchwood on 11/24/20 and underwent work up for TAVR.  He has been deemed a candidate for TAVR and presents today to meet Dr. Helen Hashimoto.  He is an 86 year old with coronary artery disease, permanent A. fib, diabetes, hypertension and hyperlipidemia.  He had a CABG in 2007 by Dr. Farris Has.  At that time he had a LIMA to the LAD, vein graft to the second obtuse marginal vessel, vein graft to the mid RCA and vein graft to the second diagonal.  He had ligation of his left atrial appendage.  ?  Fernando [name] fell in November after tripping over a low lying wall.  He was ultimately noted to have a subdural hematoma but did not require intervention.  He has been on Keppra to prevent seizures and has continued to follow with neurosurgery and neurology.  Anticoagulation was discontinued at that time.  He was referred to Dr. Wallene Huh to consider Sharyne Peach which is scheduled in October.  Unfortunately he had a second fall last month when he bent over to clean a drain and then stood up and got dizzy.  He had staples in the back of his head but did not have any further injury.  He states his last CT scan showed that the subdural hematoma had resolved completely.  ?  He had an echocardiogram on May 26 that showed an ejection fraction of 60 to 65%.  He had moderate left ventricular hypertrophy.  He had severe right ventricular dilation with decreased RV function.  PASP was 76 mmHg.  He had moderate tricuspid regurgitation.  He was noted to have aortic stenosis with a mean gradient of 25 mmHg, dimensionless index of 0.26 and valve area of 0.83 cm?[name]  Although the gradient was below, visually the valve appeared to be severely stenotic.  He was referred to [place] to consider TAVR.  ?  Fernando [name] has dyspnea with exertion after approximately 100 yards.  Since he has been on Keppra he has had significant fatigue.  Normally he is very active but has had to slow down recently.  He has had an increase in edema as well.  He denies orthopnea, chest pain, palpitations, near syncope or syncope.    ?  ?  PFTs 6/16: Normal pulmonary function studies.  FEV1 2.56 (92%).  DLCO 19.44 (84%)  ?  TAVR CTA's: 12/16/20  CHEST:   1. Thickening and calcification of the aortic valve with a normal caliber   thoracic aorta.   2. Several sub-5 mm bilateral pulmonary nodules are present that are most   likely benign. ?In a low-risk patient no follow-up needed. In a high-risk   patient follow-up chest CT at 12 months is suggested.   3. Enlarged main pulmonary artery that is likely due to pulmonary   hypertension. ?     ABDOMEN AND PELVIS:   1. Normal caliber abdominal aorta and common iliac arteries with moderate   atherosclerosis.   2. Minimal ascites   3. Moderate distal colonic diverticulosis   4. Diffuse hepatic steatosis.     Carotid US 11/30/20  1. There is mild atheromatous plaque visualized in bilateral common and internal carotid arteries  2. No hemodynamically significant (>50%) stenosis measured in?the common and internal carotid arteries bilaterally  3. There is normal antegrade flow in bilateral vertebral arteries  4. No evidence of proximal subclavian stenosis?bilaterally  5. Irregular cardiac rhythm noted.     Cardiac cath: 11/30/20  CONCLUSIONS:    1. Elevated right and left-sided filling pressures.  2. Severely elevated pulmonary artery pressures.  3. Normal cardiac output and cardiac index.  4. Severe 3-vessel obstructive native coronary artery disease.  5. Widely patent grafts, including left internal mammary artery to the left anterior descending, saphenous vein graft to diagonal artery, saphenous vein graft to marginal artery, and saphenous vein graft to right coronary artery; with negative resting full-cycle ratio of moderate disease in saphenous vein graft to marginal artery   ?  ?       Review of Systems   Constitutional: Negative.   HENT: Negative.    Eyes: Negative.    Cardiovascular: Negative.    Respiratory: Negative.    Endocrine: Negative.    Hematologic/Lymphatic: Negative.    Skin: Negative.    Musculoskeletal: Negative.    Gastrointestinal: Negative.    Genitourinary: Negative.    Neurological: Negative.    Psychiatric/Behavioral: Negative.    Allergic/Immunologic: Negative.          Objective:         ? aspirin EC (ASPIR-LOW) 81 mg tablet Take one tablet by mouth daily. Take with food.   ? furosemide (LASIX) 40 mg tablet Take one tablet by mouth every morning. (Patient taking differently: Take 80 mg by mouth every morning.)   ? levETIRAcetam XR (KEPPRA XR) 750 mg tablet Take one tablet by mouth daily for 180 days. (Patient taking differently: Take 750 mg by mouth at bedtime daily.)   ? losartan (COZAAR) 50 mg tablet TAKE 1 TABLET EVERY DAY (Patient taking differently: Take 50 mg by mouth daily.)   ? metoprolol XL (TOPROL XL) 50 mg extended release tablet TAKE 1 TABLET EVERY DAY   ? mupirocin (BACTROBAN) 2 % topical ointment Apply  topically to affected area as Needed.   ? nitroglycerin (NITROSTAT) 0.4 mg tablet Place 1 Tab under tongue every 5 minutes as needed for Chest Pain.   ? pioglitazone (ACTOS) 30 mg tablet Take 1 tablet by mouth daily.   ? potassium chloride (K-TAB) 10 mEq tablet Take one tablet by mouth daily. Take with a meal and a full glass of water. (Patient taking differently: Take 20 mEq by mouth daily. Take with a meal and a full glass of water.)   ? simvastatin (ZOCOR) 20 mg tablet TAKE ONE TABLET BY MOUTH AT BEDTIME (Patient taking differently: Take 20 mg by mouth at bedtime daily.)     Vitals:    01/10/21 1129   BP: (!) 161/77   Pulse: 68   SpO2: 97%   O2 Device: None (Room air)   PainSc: Zero   Weight: 122 kg (269 lb)   Height: 188 cm (6' 2)     Body mass index is 34.54 kg/m?[name]     Medical History:   Diagnosis Date   ? Arthritis    ? Atrial fibrillation (HCC)    ? CAD (coronary artery disease) 11/11/2008   ? DM (diabetes mellitus) (HCC)    ? Hyperlipidemia 11/11/2008   ? Hypertension 11/11/2008   ? Obesity 11/11/2008   ? Seizure [facility])      Surgical History:   Procedure Laterality Date   ? BYPASS GRAFT  2007  x4    ? ANGIOGRAPHY CORONARY ARTERY WITH RIGHT AND LEFT HEART CATHETERIZATION N/A 11/30/2020    Performed by Hajj, Alfredo Martinez, MD at [facility] CATH LAB   ? POSSIBLE PERCUTANEOUS CORONARY STENT PLACEMENT WITH ANGIOPLASTY N/A 11/30/2020    Performed by Hajj, Alfredo Martinez, MD at [facility] CATH LAB   ? HX HEART CATHETERIZATION     ? HX JOINT REPLACEMENT Bilateral    ? PROSTATECTOMY       Family History   Problem Relation Age of Onset   ? Sudden Cardiac Death Mother    ? Sudden Cardiac Death Father    ? Cancer Other    ? Arthritis-rheumatoid Other      Social History     Socioeconomic History   ? Marital status: Widowed   Tobacco Use   ? Smoking status: Never Smoker   ? Smokeless tobacco: Never Used   Substance and Sexual Activity   ? Alcohol use: No   ? Drug use: No                   Physical Exam  General Appearance: no acute distress  Skin: warm & intact  HEENT: unremarkable  Neck Veins: neck veins are flat & not distended  Carotid Arteries: no bruits  Chest Inspection: chest is normal in appearance  Auscultation/Percussion: lungs clear to auscultation, no rales, rhonchi, or wheezing  Cardiac Rhythm: regular rhythm & normal rate  Cardiac Auscultation: Normal S1 & S2, no S3 or S4, no rub  Murmurs: systolic murmur   Extremities: 1+ lower extremity edema  Abdominal Exam: soft, non-tender, no masses, bowel sounds normal  Liver & Spleen: no organomegaly  Neurologic Exam: oriented to time, place and person; no focal neurologic deficits  Psychiatric: Normal mood and affect.  Behavior is normal. Judgment and thought content normal.            Assessment and Plan:  Aortic stenosis  He has completed work-up for TAVR and has been deemed a candidate for TAVR by Dr. Mackey Birchwood and Dr. Helen Hashimoto.  His TAVR scheduled for 01/12/2021.  The procedure was reviewed with him today in detail and all of his questions were answered.    Chronic diastolic heart failure  He appears well compensated today on exam.  He describes NYHA class II heart failure symptoms.    Coronary artery disease  History of CABG x4 in 2007.  Cardiac catheterization revealed widely patent grafts  including left internal mammary artery to the left anterior descending, saphenous vein graft to diagonal artery, saphenous vein graft to marginal artery, and saphenous vein graft to right coronary artery; with negative resting full-cycle ratio of moderate disease in saphenous vein graft to marginal artery.     Permanent atrial fibrillation  He is not on anticoagulation due to frequent falls.  He is being evaluated by Dr. Wallene Huh for consideration of watchman.    Subdural hematoma  Per the patient's report recent CT showed resolution.  He follows closely with neurology here at [facility] and albeit there is some risk with bleeding during the TAVR procedure he has been cleared to proceed with TAVR and for possible short-term use of anticoagulation if needed.    We appreciate the opportunity to participate in his care.    Dorena Cookey, APRN  Pager [phone]      I had the pleasure of seeing Mr. Hellard in the valve clinic today for evaluation of his severe symptomatic aortic stenosis.  He is a very pleasant  gentleman who is accompanied by his daughter in clinic today.  He had previous coronary bypass surgery in 2007 with Dr. Farris Has.    He has had progressive limitation of his exercise tolerance and now can walk approximately 100 yards.  Echo on May 26 showed an EF of 60% with a valve area 0.8 cm? and dimensionless index of 0.26.  Cardiac cath shows patent grafts.    Had a good discussion about the risk and benefits of TAVR.  He has appropriate anatomy for transfemoral delivery.  We discussed the risk of bleeding, infection, stroke, dialysis and death.  He has expressed understanding and is comfortable with the plan.  We also reviewed the risks of 5 to 10% incidence of needing a pacemaker afterwards.  He is scheduled for TAVR this Thursday.  We will keep you updated on his progress.    Sindy Messing, M.D.

## 2021-01-10 NOTE — Anesthesia Pre-Procedure Evaluation
Anesthesia Pre-Procedure Evaluation    Name: Fernando [name]      MRN: [id number]     DOB: Dec 15, 1934     Age: 86 y.o.     Sex: male   _________________________________________________________________________     Procedure Info:   Procedure Information     Date/Time: 01/12/21 1339    Procedure: Transcatheter Aortic Valve Replacement - Femoral Artery (N/A ) - Either common femoral artery for access.  Edwards Sapien 23 S3 +2 ccs VS 26 S3    Location: CVOR 6 / CVOR    Providers: Arby Barrette, MD          Physical Assessment  Vital Signs (last filed in past 24 hours):  BP: 161/77 (08/02 1023)  Temp: 36.2 ?C (97.2 ?F) (08/02 1021)  Pulse: 73 (08/02 1021)  Respirations: 18 PER MINUTE (08/02 1021)  SpO2: 97 % (08/02 1021)  O2 Device: None (Room air) (08/02 1021)  Height: 188 cm (6' 2) (08/02 1021)  Weight: 122.4 kg (269 lb 12.8 oz) (08/02 1021)      Patient History   Allergies   Allergen Reactions   ? Sulfa (Sulfonamide Antibiotics) HIVES and RASH        Current Medications    Medication Directions   aspirin EC (ASPIR-LOW) 81 mg tablet Take one tablet by mouth daily. Take with food.   furosemide (LASIX) 40 mg tablet Take one tablet by mouth every morning.  Patient taking differently: Take 80 mg by mouth every morning.   levETIRAcetam XR (KEPPRA XR) 750 mg tablet Take one tablet by mouth daily for 180 days.  Patient taking differently: Take 750 mg by mouth at bedtime daily.   losartan (COZAAR) 50 mg tablet TAKE 1 TABLET EVERY DAY  Patient taking differently: Take 50 mg by mouth daily.   metoprolol XL (TOPROL XL) 50 mg extended release tablet TAKE 1 TABLET EVERY DAY   mupirocin (BACTROBAN) 2 % topical ointment Apply  topically to affected area as Needed.   nitroglycerin (NITROSTAT) 0.4 mg tablet Place 1 Tab under tongue every 5 minutes as needed for Chest Pain.   pioglitazone (ACTOS) 30 mg tablet Take 1 tablet by mouth daily.   potassium chloride (K-TAB) 10 mEq tablet Take one tablet by mouth daily. Take with a meal and a full glass of water.  Patient taking differently: Take 20 mEq by mouth daily. Take with a meal and a full glass of water.   simvastatin (ZOCOR) 20 mg tablet TAKE ONE TABLET BY MOUTH AT BEDTIME  Patient taking differently: Take 20 mg by mouth at bedtime daily.         Review of Systems/Medical History        PONV Screening: Non-smoker  No history of anesthetic complications  No family history of anesthetic complications        Pulmonary - negative          Cardiovascular         Exercise tolerance: <4 METS (DOE with walking short distances)       Beta Blocker therapy: Yes      Hypertension (home BPs 140s/70s),         Valvular problems/murmurs: AS      Coronary artery disease      Coronary artery bypass graft (2007)        Dysrhythmias; atrial fibrillation      No angina (never required NTG use)      CHF ( BNP 1300 on 01/10/2021)  Hyperlipidemia      No orthopnea      Syncope (~11/2020)      Pulmonary hypertension ( mean PA of 52 mmHg)      GI/Hepatic/Renal         No GERD,      Renal disease (baseline cr 1.3; cr 1.5 on 01/10/2021)        Musculoskeletal         Arthritis      Endocrine/Other       Diabetes (does not check glucose; hgba1c 8.1 on 01/10/2021 ), poorly controlled      Obesity    Constitution - negative   Physical Exam    Airway Findings      Mallampati: III      TM distance: >3 FB      Neck ROM: full      Mouth opening: good    Dental Findings:             Cardiovascular Findings:       Rhythm: regular      Rate: normal      Other findings: murmur ( systolic 3/6), peripheral edema (2+ bilat)      No carotid bruit    Pulmonary Findings:       Breath sounds clear to auscultation.    Abdominal Findings:       Obese    Neurological Findings:       Alert and oriented x 3    Constitutional findings:       No acute distress      Well-developed       Diagnostic Tests  Hematology:   Lab Results   Component Value Date    HGB 13.5 01/10/2021    HCT 40.9 01/10/2021    PLTCT 130 01/10/2021    WBC 5.5 01/10/2021    NEUT 81 06/21/2005    ANC 7.78 06/21/2005    ALC 0.68 06/21/2005    MONA 11 06/21/2005    AMC 1.03 06/21/2005    EOSA 1 06/21/2005    ABC 0.01 06/21/2005    MCV 103.4 01/10/2021    MCH 34.1 01/10/2021    MCHC 32.9 01/10/2021    MPV 8.9 01/10/2021    RDW 16.2 01/10/2021         General Chemistry:   Lab Results   Component Value Date    NA 141 01/10/2021    K 5.1 01/10/2021    CL 105 01/10/2021    CO2 29 01/10/2021    GAP 7 01/10/2021    BUN 35 01/10/2021    CR 1.51 01/10/2021    GLU 165 01/10/2021    GLU 129 06/21/2005    CA 9.3 01/10/2021    ALBUMIN 4.3 01/10/2021    OBSCA 1.14 06/19/2005    MG 2.1 07/29/2020    TOTBILI 1.1 01/10/2021    PO4 3.0 07/29/2020      Coagulation:   Lab Results   Component Value Date    PT 12.8 06/18/2005    PTT 81.1 06/18/2005    INR 1.2 06/18/2005     Echo 11/03/2020:  1. Normal LV size and systolic function.  LVEF is 60 to 65%.  No regional wall motion abnormalities.  2. Moderate concentric hypertrophy.  3. Right ventricle is severely dilated with at least moderately reduced systolic function.  D-shaped septum  4. Mildly dilated left atrium and moderately dilated right atrium  5. Moderately elevated central venous pressure  6. Moderate tricuspid  valve regurgitation  7. Calcific aortic valve with moderate to severe stenosis.  Peak velocity is 3.5 m/s and mean gradient is 25 mmHg and dimensionless index is 0.26 .  The aortic valve is heavily calcified and the degree of stenosis may be underestimated by Doppler findings.  No regurgitation  8. Elevated pulmonary artery systolic pressure at 76 mmHg  9. No pericardial effusion  ?  Compared to prior study from January 2022 peak velocity and gradients have increased in current study.  Pulmonary artery systolic pressure is similar  ?  Cardiac Cath 11/30/2020:   1. Elevated right and left-sided filling pressures.  1. Severely elevated pulmonary artery pressures, mean PA of 52 mmHg  2. .  3. Normal cardiac output and cardiac index.  4. Severe 3-vessel obstructive native coronary artery disease.  5. Widely patent grafts, including left internal mammary artery to the left anterior descending, saphenous vein graft to diagonal artery, saphenous vein graft to marginal artery, and saphenous vein graft to right coronary artery; with negative resting full-cycle ratio of moderate disease in saphenous vein graft to marginal artery as described above.  ?  RECOMMENDATIONS:  Continue workup toward TAVR plans.      Carotid US 11/30/2020:   1. There is mild atheromatous plaque visualized in bilateral common and internal carotid arteries  2. No hemodynamically significant (>50%) stenosis measured in?the common and internal carotid arteries bilaterally  3. There is normal antegrade flow in bilateral vertebral arteries  4. No evidence of proximal subclavian stenosis?bilaterally  5. Irregular cardiac rhythm noted.   ?  Compared to ultrasound 06/18/2005 there is no significant interval change in carotid velocities. Irregular rhythm is not noted on prior ultrasound.    ECG 01/10/2021: -fib, rate 72    Anesthesia Plan    ASA score: 4   Plan: general  Special equipment/procedures: Art line and CVC      Informed Consent  Use of blood products discussed with patient

## 2021-01-11 ENCOUNTER — Encounter: Admit: 2021-01-11 | Discharge: 2021-01-11 | Payer: MEDICARE

## 2021-01-11 NOTE — Progress Notes
TAVR Evaluation    Date of initial eval: 6/16  Name: Fernando [name]  DOB: 1934/11/29  MRN: [id number]  Primary provider: Hoyt Koch  Referring cardiologist: Arna Medici    Cardiac Surgeon eval: GM  Interventional Cardiologist eval: PNT    Assessment & Plan:   Severe symptomatic aortic stenosis. TAVR work up complete and results reviewed.  TAVR scheduled 8/4 with Dr. Helen Hashimoto and Dr. Riley Nearing.      Kassie Keng L Bertie Simien, APRN-NP      Anticoagulation/Antiplatelet plan: n/a    Pertinent Medical History: coronary artery disease, permanent A. fib, diabetes, hypertension and hyperlipidemia    Chronic diastolic heart failure, NYHA class II    BMI: 34.54  Allergies:   Allergies   Allergen Reactions   ? Sulfa (Sulfonamide Antibiotics) HIVES and RASH       STS: 4.935%  Frailty:   5 Meter walks complete:   ?  1st Trial = 8.15 seconds  2nd Trial = 7.62 seconds  3rd Trial = 7.62 seconds    KCCQ: 42.71    Lab: Cr 1.51, K 5.1, BNP 1339  Creat cl: 50.5    EKG: Afib, HR 72    Echo:   ejection fraction of 60 to 65%. ?He had moderate left ventricular hypertrophy. ?He had severe right ventricular dilation with decreased RV function. ?PASP was 76 mmHg. ?He had moderate tricuspid regurgitation. ?He was noted to have aortic stenosis with a mean gradient of 25 mmHg, dimensionless index of 0.26 and valve area of 0.83 cm?    Cardiac cath: 11/30/20  CONCLUSIONS:??  1. Elevated right and left-sided filling pressures.  2. Severely elevated pulmonary artery pressures.  3. Normal cardiac output and cardiac index.  4. Severe 3-vessel obstructive native coronary artery disease.  5. Widely patent grafts, including left internal mammary artery to the left anterior descending, saphenous vein graft to diagonal artery, saphenous vein graft to marginal artery, and saphenous vein graft to right coronary artery; with negative resting full-cycle ratio of moderate disease in saphenous?vein graft to marginal artery       Carotid US 11/30/20  1. There is mild atheromatous plaque visualized in bilateral common and internal carotid arteries  2. No hemodynamically significant (>50%) stenosis measured in?the common and internal carotid arteries bilaterally  3. There is normal antegrade flow in bilateral vertebral arteries  4. No evidence of proximal subclavian stenosis?bilaterally  5. Irregular cardiac rhythm noted.       PFTs 6/16:?Normal pulmonary function studies. ?FEV1 2.56 (92%). ?DLCO 19.44 (84%)    TAVR CTA's: 12/16/20  CHEST:   1. Thickening and calcification of the aortic valve with a normal caliber   thoracic aorta.   2. Several sub-5 mm bilateral pulmonary nodules are present that are most   likely benign. ?In a low-risk patient no follow-up needed. In a high-risk   patient follow-up chest CT at 12 months is suggested.   3. Enlarged main pulmonary artery that is likely due to pulmonary   hypertension. ?     ABDOMEN AND PELVIS:   1. Normal caliber abdominal aorta and common iliac arteries with moderate   atherosclerosis.   2. Minimal ascites   3. Moderate distal colonic diverticulosis   4. Diffuse hepatic steatosis.     report:  Good cor h and SOV  23 Sapien S3 with  + 2 cc additional volume vs. 26 Sapien S3   Good access bilaterally; Bifurcation higher on the left ( mid to upper femoral head)

## 2021-01-12 ENCOUNTER — Encounter: Admit: 2021-01-12 | Discharge: 2021-01-12 | Payer: MEDICARE

## 2021-01-12 ENCOUNTER — Inpatient Hospital Stay: Admit: 2021-01-12 | Discharge: 2021-01-12 | Payer: MEDICARE

## 2021-01-12 DIAGNOSIS — I10 Essential (primary) hypertension: Secondary | ICD-10-CM

## 2021-01-12 DIAGNOSIS — E785 Hyperlipidemia, unspecified: Secondary | ICD-10-CM

## 2021-01-12 DIAGNOSIS — R569 Unspecified convulsions: Secondary | ICD-10-CM

## 2021-01-12 DIAGNOSIS — E119 Type 2 diabetes mellitus without complications: Secondary | ICD-10-CM

## 2021-01-12 DIAGNOSIS — E669 Obesity, unspecified: Secondary | ICD-10-CM

## 2021-01-12 DIAGNOSIS — M1990 Unspecified osteoarthritis, unspecified site: Secondary | ICD-10-CM

## 2021-01-12 DIAGNOSIS — I2510 Atherosclerotic heart disease of native coronary artery without angina pectoris: Secondary | ICD-10-CM

## 2021-01-12 DIAGNOSIS — I4891 Unspecified atrial fibrillation: Secondary | ICD-10-CM

## 2021-01-12 MED ORDER — ROCURONIUM 10 MG/ML IV SOLN
INTRAVENOUS | 0 refills | Status: DC
Start: 2021-01-12 — End: 2021-01-12
  Administered 2021-01-12: 16:00:00 20 mg via INTRAVENOUS
  Administered 2021-01-12: 15:00:00 60 mg via INTRAVENOUS
  Administered 2021-01-12: 16:00:00 30 mg via INTRAVENOUS
  Administered 2021-01-12 (×2): 10 mg via INTRAVENOUS

## 2021-01-12 MED ORDER — PROTAMINE 10 MG/ML IV SOLN
INTRAVENOUS | 0 refills | Status: DC
Start: 2021-01-12 — End: 2021-01-12
  Administered 2021-01-12: 17:00:00 30 mg via INTRAVENOUS
  Administered 2021-01-12: 17:00:00 20 mg via INTRAVENOUS

## 2021-01-12 MED ORDER — DEXAMETHASONE SODIUM PHOSPHATE 4 MG/ML IJ SOLN
INTRAVENOUS | 0 refills | Status: DC
Start: 2021-01-12 — End: 2021-01-12
  Administered 2021-01-12: 16:00:00 4 mg via INTRAVENOUS

## 2021-01-12 MED ORDER — PHENYLEPHRINE 40 MCG/ML IN NS IV DRIP (STD CONC)
INTRAVENOUS | 0 refills | Status: DC
Start: 2021-01-12 — End: 2021-01-12
  Administered 2021-01-12 (×2): .5 ug/kg/min via INTRAVENOUS

## 2021-01-12 MED ORDER — SUGAMMADEX 100 MG/ML IV SOLN
INTRAVENOUS | 0 refills | Status: DC
Start: 2021-01-12 — End: 2021-01-12
  Administered 2021-01-12: 17:00:00 200 mg via INTRAVENOUS
  Administered 2021-01-12: 17:00:00 400 mg via INTRAVENOUS

## 2021-01-12 MED ORDER — HEPARIN (PORCINE) 1,000 UNIT/ML IJ SOLN
INTRAVENOUS | 0 refills | Status: DC
Start: 2021-01-12 — End: 2021-01-12
  Administered 2021-01-12: 16:00:00 8000 [IU] via INTRAVENOUS
  Administered 2021-01-12: 16:00:00 3000 [IU] via INTRAVENOUS

## 2021-01-12 MED ORDER — ONDANSETRON HCL (PF) 4 MG/2 ML IJ SOLN
INTRAVENOUS | 0 refills | Status: DC
Start: 2021-01-12 — End: 2021-01-12
  Administered 2021-01-12: 17:00:00 4 mg via INTRAVENOUS

## 2021-01-12 MED ORDER — PROPOFOL INJ 10 MG/ML IV VIAL
INTRAVENOUS | 0 refills | Status: DC
Start: 2021-01-12 — End: 2021-01-12
  Administered 2021-01-12: 15:00:00 50 mg via INTRAVENOUS
  Administered 2021-01-12: 17:00:00 20 mg via INTRAVENOUS
  Administered 2021-01-12: 15:00:00 50 mg via INTRAVENOUS

## 2021-01-12 MED ORDER — CEFAZOLIN 1 GRAM IJ SOLR
INTRAVENOUS | 0 refills | Status: DC
Start: 2021-01-12 — End: 2021-01-12
  Administered 2021-01-12: 16:00:00 3 g via INTRAVENOUS

## 2021-01-12 MED ORDER — LIDOCAINE (PF) 200 MG/10 ML (2 %) IJ SYRG
INTRAVENOUS | 0 refills | Status: DC
Start: 2021-01-12 — End: 2021-01-12
  Administered 2021-01-12: 15:00:00 100 mg via INTRAVENOUS

## 2021-01-12 MED ORDER — FENTANYL CITRATE (PF) 50 MCG/ML IJ SOLN
INTRAVENOUS | 0 refills | Status: DC
Start: 2021-01-12 — End: 2021-01-12
  Administered 2021-01-12: 15:00:00 100 ug via INTRAVENOUS

## 2021-01-12 MED ADMIN — FUROSEMIDE 10 MG/ML IJ SOLN [3291]: 40 mg | INTRAVENOUS | @ 18:00:00 | Stop: 2021-01-12 | NDC 70860030242

## 2021-01-12 MED ADMIN — HYDRALAZINE 20 MG/ML IJ SOLN [3697]: 10 mg | INTRAVENOUS | @ 20:00:00 | Stop: 2021-01-12 | NDC 67457029100

## 2021-01-12 MED ADMIN — MAGNESIUM SULFATE IN D5W 1 GRAM/100 ML IV PGBK [166578]: 1 g | INTRAVENOUS | @ 19:00:00 | Stop: 2021-01-27 | NDC 00264440054

## 2021-01-12 MED ADMIN — SODIUM CHLORIDE 0.9 % IV SOLP [27838]: 1000.000 mL | INTRAVENOUS | @ 15:00:00 | Stop: 2021-01-12 | NDC 00338004904

## 2021-01-12 MED ADMIN — SODIUM CHLORIDE 0.9 % IV SOLP [27838]: 1000.000 mL | INTRAVENOUS | @ 18:00:00 | Stop: 2021-01-13 | NDC 00338004904

## 2021-01-12 MED ADMIN — ASPIRIN 81 MG PO CHEW [680]: 81 mg | ORAL | @ 22:00:00 | NDC 66553000201

## 2021-01-12 MED ADMIN — HYDRALAZINE 20 MG/ML IJ SOLN [3697]: 10 mg | INTRAVENOUS | @ 18:00:00 | NDC 67457029100

## 2021-01-12 MED ADMIN — POTASSIUM CHLORIDE IN WATER 10 MEQ/50 ML IV PGBK [11075]: 10 meq | INTRAVENOUS | @ 19:00:00 | Stop: 2022-01-12 | NDC 00338070541

## 2021-01-12 MED ADMIN — LIDOCAINE (PF) 10 MG/ML (1 %) IJ SOLN [95838]: 0.2 mL | INTRAMUSCULAR | @ 15:00:00 | Stop: 2021-01-12 | NDC 63323049204

## 2021-01-12 MED ADMIN — LOSARTAN 25 MG PO TAB [80886]: 25 mg | ORAL | @ 19:00:00 | NDC 00904704761

## 2021-01-13 ENCOUNTER — Encounter: Admit: 2021-01-13 | Discharge: 2021-01-13 | Payer: MEDICARE

## 2021-01-13 ENCOUNTER — Inpatient Hospital Stay: Admit: 2021-01-13 | Discharge: 2021-01-13 | Payer: MEDICARE

## 2021-01-13 MED ADMIN — PERFLUTREN LIPID MICROSPHERES 1.1 MG/ML IV SUSP [79178]: 2 mL | INTRAVENOUS | @ 17:00:00 | Stop: 2021-01-13 | NDC 11994001116

## 2021-01-13 MED ADMIN — SIMVASTATIN 20 MG PO TAB [80437]: 20 mg | ORAL | @ 03:00:00 | NDC 63739057210

## 2021-01-13 MED ADMIN — LOSARTAN 25 MG PO TAB [80886]: 50 mg | ORAL | @ 14:00:00 | Stop: 2021-01-13 | NDC 00904704761

## 2021-01-13 MED ADMIN — CEFAZOLIN INJ 1GM IVP [210319]: 2 g | INTRAVENOUS | @ 17:00:00 | Stop: 2021-01-13 | NDC 60505614200

## 2021-01-13 MED ADMIN — SENNOSIDES-DOCUSATE SODIUM 8.6-50 MG PO TAB [40926]: 2 | ORAL | @ 03:00:00 | NDC 70000052601

## 2021-01-13 MED ADMIN — ASPIRIN 81 MG PO CHEW [680]: 81 mg | ORAL | @ 14:00:00 | Stop: 2021-01-13 | NDC 66553000201

## 2021-01-13 MED ADMIN — CEFAZOLIN INJ 1GM IVP [210319]: 2 g | INTRAVENOUS | @ 10:00:00 | Stop: 2021-01-13 | NDC 60505614200

## 2021-01-13 MED ADMIN — PIOGLITAZONE 30 MG PO TAB [82256]: 30 mg | ORAL | @ 14:00:00 | Stop: 2021-01-13 | NDC 57237022090

## 2021-01-13 MED ADMIN — SENNOSIDES-DOCUSATE SODIUM 8.6-50 MG PO TAB [40926]: 2 | ORAL | @ 14:00:00 | Stop: 2021-01-13 | NDC 70000052601

## 2021-01-13 MED ADMIN — FUROSEMIDE 40 MG PO TAB [3295]: 40 mg | ORAL | @ 20:00:00 | Stop: 2021-01-13 | NDC 51079007301

## 2021-01-13 MED ADMIN — LEVETIRACETAM 750 MG PO TB24 [172553]: 750 mg | ORAL | @ 03:00:00 | NDC 68180011807

## 2021-01-13 MED ADMIN — METOPROLOL SUCCINATE 25 MG PO TB24 [81866]: 25 mg | ORAL | @ 14:00:00 | Stop: 2021-01-13 | NDC 00904632261

## 2021-01-13 MED ADMIN — INSULIN ASPART 100 UNIT/ML SC FLEXPEN [87504]: 2 [IU] | SUBCUTANEOUS | @ 17:00:00 | Stop: 2021-01-13 | NDC 00169633910

## 2021-01-13 MED ADMIN — CEFAZOLIN INJ 1GM IVP [210319]: 2 g | INTRAVENOUS | @ 03:00:00 | Stop: 2021-01-14 | NDC 60505614200

## 2021-01-13 MED FILL — METOPROLOL SUCCINATE 25 MG PO TB24: 25 mg | ORAL | 90 days supply | Qty: 90 | Fill #1 | Status: CP

## 2021-01-16 ENCOUNTER — Encounter: Admit: 2021-01-16 | Discharge: 2021-01-16 | Payer: MEDICARE

## 2021-01-16 DIAGNOSIS — I10 Essential (primary) hypertension: Secondary | ICD-10-CM

## 2021-01-16 DIAGNOSIS — Z953 Presence of xenogenic heart valve: Secondary | ICD-10-CM

## 2021-01-23 ENCOUNTER — Encounter: Admit: 2021-01-23 | Discharge: 2021-01-23 | Payer: MEDICARE

## 2021-01-23 ENCOUNTER — Ambulatory Visit: Admit: 2021-01-23 | Discharge: 2021-01-24 | Payer: MEDICARE

## 2021-01-23 DIAGNOSIS — I350 Nonrheumatic aortic (valve) stenosis: Secondary | ICD-10-CM

## 2021-01-23 DIAGNOSIS — I10 Essential (primary) hypertension: Secondary | ICD-10-CM

## 2021-01-23 DIAGNOSIS — E785 Hyperlipidemia, unspecified: Secondary | ICD-10-CM

## 2021-01-23 DIAGNOSIS — I5032 Chronic diastolic (congestive) heart failure: Secondary | ICD-10-CM

## 2021-01-23 DIAGNOSIS — I4891 Unspecified atrial fibrillation: Secondary | ICD-10-CM

## 2021-01-23 DIAGNOSIS — I2510 Atherosclerotic heart disease of native coronary artery without angina pectoris: Secondary | ICD-10-CM

## 2021-01-23 DIAGNOSIS — E669 Obesity, unspecified: Secondary | ICD-10-CM

## 2021-01-23 DIAGNOSIS — E782 Mixed hyperlipidemia: Secondary | ICD-10-CM

## 2021-01-23 DIAGNOSIS — Z953 Presence of xenogenic heart valve: Secondary | ICD-10-CM

## 2021-01-23 DIAGNOSIS — R569 Unspecified convulsions: Secondary | ICD-10-CM

## 2021-01-23 DIAGNOSIS — Z951 Presence of aortocoronary bypass graft: Secondary | ICD-10-CM

## 2021-01-23 DIAGNOSIS — M1990 Unspecified osteoarthritis, unspecified site: Secondary | ICD-10-CM

## 2021-01-23 DIAGNOSIS — E119 Type 2 diabetes mellitus without complications: Secondary | ICD-10-CM

## 2021-01-23 DIAGNOSIS — I4821 Permanent atrial fibrillation: Secondary | ICD-10-CM

## 2021-01-23 NOTE — Progress Notes
[  9:28 AM] Janelle McClymont  On [address] can you send a BMP order to [name] in [place] for Dx: chronic diastolic heart failure?  He was on a higher dose of lasix after tavr and I just want to check labs.  Thanks!    BMP orders faxed to [name] [phone].

## 2021-01-23 NOTE — Progress Notes
Telehealth Visit Note    Date of Service: 01/23/2021    Subjective:      Obtained patient's verbal consent to treat them and their agreement to [facility] financial policy and NPP via this telehealth visit during the [facility]       Fernando [name] is a 86 y.o. male.    History of Present Illness  I had the pleasure of seeing Fernando [name] for hospital follow up after recent TAVR.  He is a very pleasant 86 year old male with progressive aortic stenosis, coronary artery disease, permanent atrial fibrillation, diabetes, hypertension, and hyperlipidemia.  He had a CABG in 2007 by Dr. Farris Has and at that time he had a LIMA to the LAD, vein graft to the second obtuse marginal vessel, vein graft to the mid RCA and vein graft to the second diagonal.  He had ligation of his left atrial appendage.  He also has a history of subdural hematoma after a fall.  His echocardiogram in May revealed progressive aortic stenosis and he was deemed a candidate for TAVR.  Cardiac cath prior to TAVR revealed widely patent grafts, including left internal mammary artery to left anterior descending, saphenous vein graft to diagonal artery, saphenous vein graft marginal artery, and saphenous vein graft to right coronary artery with negative resting full cycle ratio of moderate disease in the saphenous vein graft to the marginal artery.    He was admitted on 01/12/2021 and underwent successful TAVR with a 26 mm S3 bioprosthetic valve with Dr. Helen Hashimoto and Dr. Riley Nearing.  His heart rhythm remained stable overnight which was chronic atrial fibrillation and his rates were well controlled.  Preoperatively he had been holding anticoagulation due to falls and subdural hematoma with plans for watchman device in the future so anticoagulation was held at discharge.  A postoperative echocardiogram demonstrated a 26 mm S3 TAVR bioprosthetic valve present without stenosis or regurgitation.  He was diuresed with IV Lasix and discharged on twice daily Lasix for a week then to resume daily.  He was discharged home on postoperative day #1.    Today he reports only mild symptomatic improvement since TAVR.  He does note that he has been walking 30 to 40 minutes daily and still has problems with fatigue and decreased stamina.  He has some mild shortness of breath which he feels is mildly improved.  He denies orthopnea, PND, or lower extremity edema.  His weight is down 2 pounds since hospital discharge.  He reports blood pressures have been running 140s/70s.  He denies chest pain, palpitations, dizziness, lightheadedness, or near syncope.  He denies groin access site complications.  He is scheduled to start cardiac rehab on Wednesday.           Review of Systems   Constitutional: Negative.    HENT: Negative.    Eyes: Negative.    Respiratory: Negative.    Cardiovascular: Negative.    Gastrointestinal: Negative.    Endocrine: Negative.    Genitourinary: Negative.    Musculoskeletal: Negative.    Skin: Negative.    Allergic/Immunologic: Negative.    Neurological: Negative.    Hematological: Negative.    Psychiatric/Behavioral: Negative.          Objective:         ? acetaminophen (TYLENOL) 325 mg tablet Take two tablets by mouth every 6 hours as needed.   ? aspirin EC (ASPIR-LOW) 81 mg tablet Take one tablet by mouth daily. Take with food.   ?  furosemide (LASIX) 40 mg tablet Take one tablet by mouth twice daily for 5 days, THEN one tablet every morning for 5 days.   ? levETIRAcetam XR (KEPPRA XR) 750 mg tablet Take one tablet by mouth daily for 180 days. (Patient taking differently: Take 750 mg by mouth at bedtime daily.)   ? losartan (COZAAR) 50 mg tablet TAKE 1 TABLET EVERY DAY (Patient taking differently: Take 50 mg by mouth daily.)   ? metoprolol succinate XL (TOPROL XL) 25 mg extended release tablet Take one tablet by mouth daily.   ? mupirocin (BACTROBAN) 2 % topical ointment Apply  topically to affected area as Needed.   ? nitroglycerin (NITROSTAT) 0.4 mg tablet Place 1 Tab under tongue every 5 minutes as needed for Chest Pain.   ? pioglitazone (ACTOS) 30 mg tablet Take 1 tablet by mouth daily.   ? potassium chloride (K-TAB) 10 mEq tablet Take one tablet by mouth daily. Take with a meal and a full glass of water.   ? senna/docusate (SENOKOT-S) 8.6/50 mg tablet Take two tablets by mouth daily. Indications: constipation   ? simvastatin (ZOCOR) 20 mg tablet TAKE ONE TABLET BY MOUTH AT BEDTIME (Patient taking differently: Take 20 mg by mouth at bedtime daily.)          Telehealth Patient Reported Vitals     Row Name 01/23/21 0842                BP: 147/85        BP Source: Arm, Left Lower        BP Position: Sitting        BP Method: Automatic/Digital Reading        Pulse: 58        Weight: 120.7 kg (266 lb)        Height: 188 cm (6' 2)        Pain Score: Zero                  Telehealth Body Mass Index: 34.15 at 01/23/2021  9:25 AM    Physical Exam  General Appearance: no acute distress  HEENT: unremarkable  Neck Veins: neck veins are flat & not distended  Chest Inspection: Nonlabored respirations  Neurologic Exam: oriented to time, place and person; no focal neurologic deficits  Psychiatric: Normal mood and affect.  Behavior is normal. Judgment and thought content normal.            Assessment and Plan:  Aortic stenosis  He underwent successful TAVR with a 26 mm S3 bioprosthetic valve.  He has had mild symptomatic improvement.  He plans to start cardiac rehab later this week.  I have asked him to gradually continue to increase his activity as tolerated.  He will plan to return on 9/9 for echo Doppler and office visit as part of the TVT registry.    Chronic diastolic heart failure  He denies evidence of volume overload.  She describes NYHA class II heart failure symptoms.    Coronary artery disease  History of CABG x4 in 2007.  Recent catheter revealed stable coronary anatomy and patent grafts.  He is a candidate for continued risk factor modification and medical management.    Permanent atrial fibrillation  He is not on anticoagulation due to frequent falls.  He has been following with the EP team for consideration of watchman.    Subdural hematoma    Hypertension    Pulmonary hypertension  Recent right heart catheterization revealed PA  pressure 90/33 mmHg with a mean PA of 52 mmHg.                       11 minutes spent on this patient's encounter with counseling and coordination of care taking >50% of the visit.

## 2021-01-26 ENCOUNTER — Encounter: Admit: 2021-01-26 | Discharge: 2021-01-26 | Payer: MEDICARE

## 2021-01-26 DIAGNOSIS — I5032 Chronic diastolic (congestive) heart failure: Secondary | ICD-10-CM

## 2021-01-26 DIAGNOSIS — Z953 Presence of xenogenic heart valve: Secondary | ICD-10-CM

## 2021-01-26 LAB — BASIC METABOLIC PANEL
CHLORIDE: 107
POTASSIUM: 4.6
SODIUM: 138

## 2021-01-26 NOTE — Progress Notes
[  9:28 AM] Fernando [name]   On [name] can you send a BMP order to [name] in [place] for Dx: chronic diastolic heart failure? He was on a higher dose of lasix after tavr and I just want to check labs. Thanks!     Order faxed to [name] at [phone].

## 2021-02-17 ENCOUNTER — Ambulatory Visit: Admit: 2021-02-17 | Discharge: 2021-02-17 | Payer: MEDICARE

## 2021-02-17 ENCOUNTER — Encounter: Admit: 2021-02-17 | Discharge: 2021-02-17 | Payer: MEDICARE

## 2021-02-17 DIAGNOSIS — I5032 Chronic diastolic (congestive) heart failure: Secondary | ICD-10-CM

## 2021-02-17 DIAGNOSIS — Z953 Presence of xenogenic heart valve: Secondary | ICD-10-CM

## 2021-02-17 DIAGNOSIS — I2510 Atherosclerotic heart disease of native coronary artery without angina pectoris: Secondary | ICD-10-CM

## 2021-02-17 DIAGNOSIS — R0989 Other specified symptoms and signs involving the circulatory and respiratory systems: Secondary | ICD-10-CM

## 2021-02-17 DIAGNOSIS — I4891 Unspecified atrial fibrillation: Secondary | ICD-10-CM

## 2021-02-17 DIAGNOSIS — Z951 Presence of aortocoronary bypass graft: Secondary | ICD-10-CM

## 2021-02-17 DIAGNOSIS — E119 Type 2 diabetes mellitus without complications: Secondary | ICD-10-CM

## 2021-02-17 DIAGNOSIS — E785 Hyperlipidemia, unspecified: Secondary | ICD-10-CM

## 2021-02-17 DIAGNOSIS — I10 Essential (primary) hypertension: Secondary | ICD-10-CM

## 2021-02-17 DIAGNOSIS — M1990 Unspecified osteoarthritis, unspecified site: Secondary | ICD-10-CM

## 2021-02-17 DIAGNOSIS — R569 Unspecified convulsions: Secondary | ICD-10-CM

## 2021-02-17 DIAGNOSIS — N1831 Stage 3a chronic kidney disease (HCC): Secondary | ICD-10-CM

## 2021-02-17 DIAGNOSIS — I4821 Permanent atrial fibrillation: Secondary | ICD-10-CM

## 2021-02-17 DIAGNOSIS — E782 Mixed hyperlipidemia: Secondary | ICD-10-CM

## 2021-02-17 DIAGNOSIS — I350 Nonrheumatic aortic (valve) stenosis: Secondary | ICD-10-CM

## 2021-02-17 DIAGNOSIS — E669 Obesity, unspecified: Secondary | ICD-10-CM

## 2021-02-17 LAB — CBC
HEMATOCRIT: 36 % — ABNORMAL LOW (ref 40–50)
HEMOGLOBIN: 12 g/dL — ABNORMAL LOW (ref 13.5–16.5)
MCHC: 34 g/dL (ref 32.0–36.0)
MCV: 101 FL — ABNORMAL HIGH (ref 80–100)
MPV: 9.4 FL (ref 7–11)
PLATELET COUNT: 111 K/UL — ABNORMAL LOW (ref 150–400)
RBC COUNT: 3.6 M/UL — ABNORMAL LOW (ref 4.4–5.5)
RDW: 15 % — ABNORMAL HIGH (ref 11–15)
WBC COUNT: 4.6 K/UL (ref 4.5–11.0)

## 2021-02-17 LAB — BASIC METABOLIC PANEL
CHLORIDE: 106 MMOL/L — ABNORMAL HIGH (ref 98–110)
CO2: 25 MMOL/L (ref 21–30)
EGFR: 51 mL/min — ABNORMAL LOW (ref >60)
POTASSIUM: 4.5 MMOL/L (ref >60)
SODIUM: 142 MMOL/L (ref 137–147)

## 2021-02-17 MED ORDER — PERFLUTREN LIPID MICROSPHERES 1.1 MG/ML IV SUSP
1-10 mL | Freq: Once | INTRAVENOUS | 0 refills | Status: AC | PRN
Start: 2021-02-17 — End: ?

## 2021-02-17 MED ORDER — LOSARTAN 100 MG PO TAB
100 mg | ORAL_TABLET | Freq: Every day | ORAL | 3 refills | 30.00000 days | Status: AC
Start: 2021-02-17 — End: ?

## 2021-02-17 NOTE — Patient Instructions
Increase Losartan to 100mg  daily.  This will help to get your blood pressure under better control.  BP goal <130/80.    Continue cardiac rehab and increase activity as tolerated.     Continue to follow with Dr. as scheduled.     You will return for 1 year follow up and echo doppler.

## 2021-02-17 NOTE — Progress Notes
Vascular Access Team consulted to obtain lab specimen.    Ultrasound Used: Yes    How Many Attempts: 1    Location of Unsuccessful Attempts: 0    At 1525 approximately 8 ml of blood obtained from LAC. Patient tolerated the procedure well. Specimen labeled and sent to lab.

## 2021-02-17 NOTE — Progress Notes
Date of Service: 02/17/2021    Fernando [name] is a 86 y.o. male.       HPI     I had the pleasure of seeing Fernando [name] for 1 month follow-up after TAVR.  He is a very pleasant 86 year old male with progressive aortic stenosis, coronary artery disease, permanent atrial fibrillation, diabetes, hypertension, and hyperlipidemia.  He had a CABG in 2007 by Dr. Farris Has and at that time he had a LIMA to the LAD, vein graft to the second obtuse marginal vessel, vein graft to the mid RCA and vein graft to the second diagonal. ?He had ligation of his left atrial appendage.  He also has a history of subdural hematoma after a fall.  His echocardiogram in May revealed progressive aortic stenosis and he was deemed a candidate for TAVR.  Cardiac cath prior to TAVR revealed widely patent grafts, including left internal mammary artery to left anterior descending, saphenous vein graft to diagonal artery, saphenous vein graft marginal artery, and saphenous vein graft to right coronary artery with negative resting full cycle ratio of moderate disease in the saphenous vein graft to the marginal artery.  ?  He was admitted on 01/12/2021 and underwent successful TAVR with a 26 mm S3 bioprosthetic valve with Dr. Helen Hashimoto and Dr. Riley Nearing.  His heart rhythm remained stable overnight which was chronic atrial fibrillation and his rates were well controlled.  Preoperatively he had been holding anticoagulation due to falls and subdural hematoma  so anticoagulation was held at discharge.  A postoperative echocardiogram demonstrated a 26 mm S3 TAVR bioprosthetic valve present without stenosis or regurgitation.  He was diuresed with IV Lasix and discharged on twice daily Lasix for a week then to resume daily.  He was discharged home on postoperative day #1.    Today he reports mild symptomatic improvement.  He has been participating in cardiac rehab and states he has been tolerating well.  He feels like he has mildly increased energy as well.  He denies orthopnea or PND.  He has chronic lower extremity edema throughout the day which is usually resolved by morning.  His weights have been stable.  Blood pressure at home has been running 135-150 systolic.  He denies chest pain, palpitations, dizziness, lightheadedness, or near syncope.               Vitals:    02/17/21 1509   BP: (!) 148/82   BP Source: Arm, Left Upper   Pulse: 67   PainSc: Zero   Weight: 121.6 kg (268 lb)   Height: 188 cm (6' 2)     Body mass index is 34.41 kg/m?[name]     Past Medical History  Patient Active Problem List    Diagnosis Date Noted   ? S/p TAVR (transcatheter aortic valve replacement), bioprosthetic 01/12/2021     01/12/21 Dr. Helen Hashimoto and Dr. Riley Nearing     ? Hypokalemia 01/12/2021   ? Hypomagnesemia 01/12/2021   ? Nonrheumatic aortic valve stenosis 12/29/2020   ? Acute on chronic combined systolic and diastolic congestive heart failure, NYHA class 2 (HCC) 12/29/2020   ? Stage 3a chronic kidney disease (HCC) 12/29/2020   ? Venous stasis of lower extremity 12/29/2020   ? Pulmonary HTN (HCC) 11/24/2020   ? Chronic diastolic heart failure (HCC) 11/24/2020   ? Subdural hematoma (HCC) 08/15/2020   ? Permanent atrial fibrillation (HCC) 06/29/2020   ? Osteoarthrosis, unspecified whether generalized or localized, lower leg 03/11/2012   ? Primary  localized osteoarthrosis, lower leg 12/18/2011   ? Pain in joint, lower leg 12/18/2011   ? S/P knee replacement 12/18/2011   ? Knee pain 10/25/2011   ? Osteoarthritis of knee 10/25/2011   ? 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 CAD (coronary artery disease) 11/11/2008     status post CABG on 06/18/2005 (patient received a LIMA to      LAD, reverse SVG to OM branch, reverse SVG to mid-RCA and reverse SVG to second      diagonal).  Patient is angina free and he is a functional Class I.  History of chest pain and admission in [facility] in 11/2007.  Exercise myocardial perfusion imaging study dated 11/13/2007, unremarkable for ischemia.     ? Obesity 11/11/2008   ? Hyperlipidemia 11/11/2008   ? Hypertension, essential 11/11/2008         Review of Systems   Constitutional: Negative.   HENT: Negative.    Eyes: Negative.    Cardiovascular: Negative.    Respiratory: Negative.    Endocrine: Negative.    Hematologic/Lymphatic: Negative.    Skin: Negative.    Musculoskeletal: Negative.    Gastrointestinal: Negative.    Genitourinary: Negative.    Neurological: Negative.    Psychiatric/Behavioral: Negative.    All other systems reviewed and are negative.      Physical Exam  General Appearance: no acute distress  Skin: warm & intact  HEENT: unremarkable  Neck Veins: neck veins are flat & not distended  Carotid Arteries: no bruits  Chest Inspection: chest is normal in appearance  Auscultation/Percussion: lungs clear to auscultation, no rales, rhonchi, or wheezing  Cardiac Rhythm: regular rhythm & normal rate  Cardiac Auscultation: Normal S1 & S2, no S3 or S4, no rub  Murmurs: no cardiac murmurs   Extremities: no lower extremity edema; 2+ symmetric distal pulses  Abdominal Exam: soft, non-tender, no masses, bowel sounds normal  Liver & Spleen: no organomegaly  Neurologic Exam: oriented to time, place and person; no focal neurologic deficits  Psychiatric: Normal mood and affect.  Behavior is normal. Judgment and thought content normal.         Cardiovascular Studies  Preliminary EKG: Atrial fibrillation, ventricular rate 67    Cardiovascular Health Factors  Vitals BP Readings from Last 3 Encounters:   02/17/21 (!) 148/82   02/17/21 (!) 148/83   01/13/21 135/74     Wt Readings from Last 3 Encounters:   02/17/21 121.6 kg (268 lb)   02/17/21 121.6 kg (268 lb)   01/13/21 118.4 kg (261 lb)     BMI Readings from Last 3 Encounters:   02/17/21 34.41 kg/m?   02/17/21 34.41 kg/m?   01/13/21 33.51 kg/m?      Smoking Social History     Tobacco Use   Smoking Status Never Smoker   Smokeless Tobacco Never Used      Lipid Profile Cholesterol   Date Value Ref Range Status   03/30/2020 123  Final     HDL   Date Value Ref Range Status   03/30/2020 46  Final     LDL   Date Value Ref Range Status   03/30/2020 66  Final     Triglycerides   Date Value Ref Range Status   03/30/2020 55  Final      Blood Sugar Hemoglobin A1C   Date Value Ref Range Status   01/10/2021 8.1 (H) 4.0 - 6.0 % Final     Comment:  The ADA recommends that most patients with type 1 and type 2 diabetes maintain   an A1c level <7%.       Glucose   Date Value Ref Range Status   01/25/2021 123  Final   01/13/2021 177 (H) 70 - 100 MG/DL Final   16/03/9603 540 (H) 70 - 100 MG/DL Final   98/04/9146 829 (H) 70 - 110 MG/DL Final   56/21/3086 95 70 - 110 MG/DL Final   57/84/6962 952 (H) 70 - 110 MG/DL Final     Glucose, POC   Date Value Ref Range Status   01/13/2021 196 (H) 70 - 100 MG/DL Final   84/13/2440 102 (H) 70 - 100 MG/DL Final   72/53/6644 034 (H) 70 - 100 MG/DL Final          Problems Addressed Today  Encounter Diagnoses   Name Primary?   ? S/p TAVR (transcatheter aortic valve replacement), bioprosthetic Yes   ? Nonrheumatic aortic valve stenosis    ? Cardiovascular symptoms    ? Permanent atrial fibrillation (HCC)    ? Hypertension, essential    ? Mixed hyperlipidemia    ? Chronic diastolic heart failure (HCC)    ? Coronary artery disease involving native coronary artery of native heart without angina pectoris    ? S/P CABG (coronary artery bypass graft)    ? Stage 3a chronic kidney disease (HCC)        Assessment and Plan       ?Aortic stenosis  He underwent successful TAVR with a 26 mm S3 bioprosthetic valve.    He notes mild symptomatic improvement.  I have encouraged him to continue cardiac rehab.  He will return for 1 year follow-up with echo Doppler as part of the TVT registry.  He has a follow-up with Dr. Justice Britain next week.  I did ask him to increase losartan to 100 mg daily for better blood pressure control at home.  He should continue aspirin and SBE prophylaxis lifelong.  ?  Chronic diastolic heart failure  He denies evidence of volume overload.  She describes NYHA class II heart failure symptoms.  ?  Coronary artery disease  History of CABG x4 in 2007.  Recent catheter revealed stable coronary anatomy and patent grafts.  He is a candidate for continued risk factor modification and medical management.  ?  Permanent atrial fibrillation  He is not on anticoagulation due to frequent falls.    Subdural hematoma  ?  Hypertension  His blood pressure at home has been moderately elevated.  I have asked him to increase losartan to 100 mg daily and to continue to monitor blood pressures at home.    Pulmonary hypertension  Recent right heart catheterization revealed PA pressure 90/33 mmHg with a mean PA of 52 mmHg.  ?  I appreciate the opportunity to participate in his care.    Dorena Cookey, APRN  Pager [phone]           Current Medications (including today's revisions)  ? acetaminophen (TYLENOL) 325 mg tablet Take two tablets by mouth every 6 hours as needed.   ? aspirin EC (ASPIR-LOW) 81 mg tablet Take one tablet by mouth daily. Take with food.   ? levETIRAcetam XR (KEPPRA XR) 750 mg tablet Take one tablet by mouth daily for 180 days. (Patient taking differently: Take 750 mg by mouth at bedtime daily.)   ? losartan (COZAAR) 100 mg tablet Take one tablet by mouth daily.   ? metoprolol succinate  XL (TOPROL XL) 25 mg extended release tablet Take one tablet by mouth daily.   ? mupirocin (BACTROBAN) 2 % topical ointment Apply  topically to affected area as Needed.   ? nitroglycerin (NITROSTAT) 0.4 mg tablet Place 1 Tab under tongue every 5 minutes as needed for Chest Pain.   ? pioglitazone (ACTOS) 30 mg tablet Take 1 tablet by mouth daily.   ? potassium chloride (K-TAB) 10 mEq tablet Take one tablet by mouth daily. Take with a meal and a full glass of water.   ? senna/docusate (SENOKOT-S) 8.6/50 mg tablet Take two tablets by mouth daily. Indications: constipation   ? simvastatin (ZOCOR) 20 mg tablet TAKE ONE TABLET BY MOUTH AT BEDTIME (Patient taking differently: Take 20 mg by mouth at bedtime daily.)

## 2021-03-21 ENCOUNTER — Encounter: Admit: 2021-03-21 | Discharge: 2021-03-21 | Payer: MEDICARE

## 2021-03-26 ENCOUNTER — Encounter: Admit: 2021-03-26 | Discharge: 2021-03-26 | Payer: MEDICARE

## 2021-03-27 ENCOUNTER — Encounter: Admit: 2021-03-27 | Discharge: 2021-03-27 | Payer: MEDICARE

## 2021-03-27 MED ORDER — METOPROLOL SUCCINATE 25 MG PO TB24
25 mg | ORAL_TABLET | Freq: Every day | ORAL | 0 refills | 90.00000 days | Status: AC
Start: 2021-03-27 — End: ?

## 2021-04-01 ENCOUNTER — Encounter: Admit: 2021-04-01 | Discharge: 2021-04-01 | Payer: MEDICARE

## 2021-04-13 ENCOUNTER — Encounter: Admit: 2021-04-13 | Discharge: 2021-04-13 | Payer: MEDICARE

## 2021-04-13 ENCOUNTER — Ambulatory Visit: Admit: 2021-04-13 | Discharge: 2021-04-14 | Payer: MEDICARE

## 2021-04-13 DIAGNOSIS — I4891 Unspecified atrial fibrillation: Secondary | ICD-10-CM

## 2021-04-13 DIAGNOSIS — I10 Essential (primary) hypertension: Secondary | ICD-10-CM

## 2021-04-13 DIAGNOSIS — E785 Hyperlipidemia, unspecified: Secondary | ICD-10-CM

## 2021-04-13 DIAGNOSIS — E669 Obesity, unspecified: Secondary | ICD-10-CM

## 2021-04-13 DIAGNOSIS — I2510 Atherosclerotic heart disease of native coronary artery without angina pectoris: Secondary | ICD-10-CM

## 2021-04-13 DIAGNOSIS — R569 Unspecified convulsions: Secondary | ICD-10-CM

## 2021-04-13 DIAGNOSIS — Z951 Presence of aortocoronary bypass graft: Secondary | ICD-10-CM

## 2021-04-13 DIAGNOSIS — M1990 Unspecified osteoarthritis, unspecified site: Secondary | ICD-10-CM

## 2021-04-13 DIAGNOSIS — E782 Mixed hyperlipidemia: Secondary | ICD-10-CM

## 2021-04-13 DIAGNOSIS — E119 Type 2 diabetes mellitus without complications: Secondary | ICD-10-CM

## 2021-04-13 NOTE — Progress Notes
Date of Service: 04/13/2021    Fernando [name] is a 86 y.o. male.       HPI     Fernando [name] severe aortic valve stenosis underwent transcatheter aortic valve replacement on 01/12/2021 receiving a 26 mm S3 valve.  This is led to some mild improvement in his dyspnea with exertion.  He is currently attending cardiac rehab 3 days a week where he walks on the treadmill for 20 minutes and uses the NuStep machine for 20 minutes.  He has lost 20 pounds over the past month with his current diuretic regimen.  It all appears to be water weight that he is lost. Fernando [name] reports consistent gradual improvement in his chronic venous stasis ulcer ulcer on the medial left pretibial surface.    He continues to treated with medicated patches. Fernando [name] been followed in the past for coronary artery disease,?having undergone coronary bypass surgery in 2007.??He has also been followed for permanent atrial fibrillation. ?He fell in December 2021. ?He then developed abnormalities in?balance and evaluation revealed a subdural hematoma. ?He did have a seizure. ?He was taken off his aspirin and Eliquis and surgical intervention was not required. ?He was placed on Keppra to prevent recurrent seizures.  It is possible that his Keppra has been associated with some lethargy. His CHA2DS2-VASc score is?5?for age,?diabetes, peripheral vascular disease?and hypertension.? He has had prior surgical ligation of his left atrial appendage.  Otherwise,?the patient?reports that he continues to feel well and has no complaints.??The patient reports no angina, palpitations, sensation of sustained forceful heart pounding, lightheadedness or syncope. The patient reports no myalgias, bleeding abnormalities,?or new neurologic abnormalities. On May 24, 2016?during a?routine visit,?a routine ECG revealed atrial fibrillation.?At that time he reported no palpitations or sensation of sustained forceful heart pounding.?  He did not want to consider cardioversion, antiarrhythmic therapy or arrhythmia ablation and excepted permanent atrial fibrillation.[name]?He deferred anticoagulation until November 2021 when he started apixaban.Fernando [name] .  Also fell while cleaning his driveway drain on Nov 02 2020.  This required 3 staples in the back of his head.         Vitals:    04/13/21 1030   BP: 132/62   BP Source: Arm, Left Upper   Pulse: 54   SpO2: 94%   O2 Device: None (Room air)   PainSc: Zero   Weight: 118.3 kg (260 lb 12.8 oz)   Height: 188 cm (6' 2)     Body mass index is 33.48 kg/m?[name]     Past Medical History  Patient Active Problem List    Diagnosis Date Noted   ? S/p TAVR (transcatheter aortic valve replacement), bioprosthetic 01/12/2021     01/12/21 Dr. Helen Hashimoto and Dr. Riley Nearing     ? Hypokalemia 01/12/2021   ? Hypomagnesemia 01/12/2021   ? Nonrheumatic aortic valve stenosis 12/29/2020   ? Acute on chronic combined systolic and diastolic congestive heart failure, NYHA class 2 (HCC) 12/29/2020   ? Stage 3a chronic kidney disease (HCC) 12/29/2020   ? Venous stasis of lower extremity 12/29/2020   ? Pulmonary HTN (HCC) 11/24/2020   ? Chronic diastolic heart failure (HCC) 11/24/2020   ? Subdural hematoma 08/15/2020   ? Permanent atrial fibrillation (HCC) 06/29/2020   ? Osteoarthrosis, unspecified whether generalized or localized, lower leg 03/11/2012   ? Primary localized osteoarthrosis, lower leg 12/18/2011   ? Pain in joint, lower leg 12/18/2011   ? S/P knee replacement 12/18/2011   ? Knee pain 10/25/2011   ? Osteoarthritis  of knee 10/25/2011   ? 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 CAD (coronary artery disease) 11/11/2008     status post CABG on 06/18/2005 (patient received a LIMA to      LAD, reverse SVG to OM branch, reverse SVG to mid-RCA and reverse SVG to second      diagonal).  Patient is angina free and he is a functional Class I.  History of chest pain and admission in [facility] in 11/2007.  Exercise myocardial perfusion imaging study dated 11/13/2007, unremarkable for      ischemia.     ? Obesity 11/11/2008   ? Hyperlipidemia 11/11/2008   ? Hypertension, essential 11/11/2008         Review of Systems   Constitutional: Negative.   HENT: Negative.    Eyes: Negative.    Cardiovascular: Negative.    Respiratory: Negative.    Endocrine: Negative.    Hematologic/Lymphatic: Negative.    Skin: Negative.    Musculoskeletal: Negative.    Gastrointestinal: Negative.    Genitourinary: Negative.    Neurological: Negative.    Psychiatric/Behavioral: Negative.    Allergic/Immunologic: Negative.        Physical Exam  GENERAL: The patient is well developed, well nourished, resting comfortably and in no distress.   HEENT: No abnormalities of the visible oro-nasopharynx, conjunctiva or sclera are noted.  NECK: There is no jugular venous distension. Carotids are palpable and without bruits. There is no thyroid enlargement.  Chest: Lung fields are clear to auscultation. There are no wheezes or crackles.  CV: There is a regular rhythm. The first and second heart sounds are normal.  A soft systolic ejection murmur is heard.  There are no diastolic murmurs, gallops or rubs.  ABD: The abdomen is soft and supple with normal bowel sounds. There is no hepatosplenomegaly, ascites, tenderness, masses or bruits.  Neuro: There are no focal motor defects. Ambulation is normal. Cognitive function appears normal.  Ext: There is 1-2+ bilateral lower extremity edema  evidence of deep vein thrombosis. Peripheral pulses are satisfactory.    SKIN: There are no rashes and no cellulitis.  There is a nickel size healing wound on the medial surface of his left lower foreleg.  PSYCH: The patient is calm, rationale and oriented.    Cardiovascular Studies  A twelve-lead ECG obtained on January 13, 2021 reveals atrial fibrillation with a heart rate of 61 bpm.  Right bundle branch block is noted.  Echo Doppler 02/17/2021.  Interpretation Summary    1. A septal wall motion abnormality is noted consistent with right ventricular pressure and volume overload.  No other regional wall motion abnormalities are identified. Overall left ventricular systolic function appears normal. The estimated left ventricular ejection fraction is 60%. Left ventricular contractility appears similar when compared with the prior echocardiogram performed on 01/13/2021.  2. Severe right ventricular enlargement with moderately reduced right ventricular systolic function.  3. Severe right atrial enlargement.  4. There is a 26 mm S3 transcatheter bioprosthetic valve present.  There is no evidence for significant bioprosthetic aortic valve stenosis or regurgitation.  5. Severe tricuspid valve regurgitation.  Mild mitral valve regurgitation.  6. No pericardial effusion is seen.  7. The estimated peak systolic PA pressure = 76 mmHg.  Compared with the prior study performed on 01/13/2021, tricuspid valve regurgitation now appears severe and the estimated peak systolic PA pressure has increased from 59 to 76 mmHg.    Cardiovascular Health Factors  Vitals BP Readings  from Last 3 Encounters:   04/13/21 132/62   02/17/21 (!) 148/83   02/17/21 (!) 148/82     Wt Readings from Last 3 Encounters:   04/13/21 118.3 kg (260 lb 12.8 oz)   02/17/21 121.6 kg (268 lb)   02/17/21 121.6 kg (268 lb)     BMI Readings from Last 3 Encounters:   04/13/21 33.48 kg/m?   02/17/21 34.41 kg/m?   02/17/21 34.41 kg/m?      Smoking Social History     Tobacco Use   Smoking Status Never Smoker   Smokeless Tobacco Never Used      Lipid Profile Cholesterol   Date Value Ref Range Status   03/30/2020 123  Final     HDL   Date Value Ref Range Status   03/30/2020 46  Final     LDL   Date Value Ref Range Status   03/30/2020 66  Final     Triglycerides   Date Value Ref Range Status   03/30/2020 55  Final      Blood Sugar Hemoglobin A1C   Date Value Ref Range Status   01/10/2021 8.1 (H) 4.0 - 6.0 % Final     Comment:     The ADA recommends that most patients with type 1 and type 2 diabetes maintain   an A1c level <7%.       Glucose   Date Value Ref Range Status   02/17/2021 108 (H) 70 - 100 MG/DL Final   95/62/1308 657  Final   01/13/2021 177 (H) 70 - 100 MG/DL Final   84/69/6295 284 (H) 70 - 110 MG/DL Final   13/24/4010 95 70 - 110 MG/DL Final   27/25/3664 403 (H) 70 - 110 MG/DL Final     Glucose, POC   Date Value Ref Range Status   01/13/2021 196 (H) 70 - 100 MG/DL Final   47/42/5956 387 (H) 70 - 100 MG/DL Final   56/43/3295 188 (H) 70 - 100 MG/DL Final          Problems Addressed Today  Encounter Diagnoses   Name Primary?   ? Coronary artery disease involving native coronary artery of native heart without angina pectoris Yes   ? Mixed hyperlipidemia    ? S/P CABG (coronary artery bypass graft)    ? Hypertension, essential        Assessment and Plan     Fernando [name] appears to be doing well following transcatheter aortic valve replacement.  He is also doing well on his current diuretic regimen.  His congestive heart failure appears very well compensated on examination today.  In addition his chronic venous stasis ulcer on his left lower foreleg appears to be healing with continued local therapy.  I would strongly recommend that Fernando [name] continue cardiac rehabilitation 3 times a week and believe that it is medically necessary.  It has had a strong beneficial effect for the patient.  He was given a requisition to check his Chem-7 and serum magnesium considering his current diuretic regimen.  I have asked him to return for follow-up in 6 months time. The total time spent during this interview and exam was 30 minutes.         Current Medications (including today's revisions)  ? acetaminophen (TYLENOL) 325 mg tablet Take two tablets by mouth every 6 hours as needed.   ? aspirin EC (ASPIR-LOW) 81 mg tablet Take one tablet by mouth daily. Take with food.   ? furosemide (LASIX)  40 mg tablet Take 40 mg by mouth twice daily.   ? levETIRAcetam XR (KEPPRA XR) 750 mg tablet Take one tablet by mouth daily for 180 days. (Patient taking differently: Take 750 mg by mouth at bedtime daily.)   ? losartan (COZAAR) 100 mg tablet Take one tablet by mouth daily.   ? metOLazone (ZAROXOLYN) 2.5 mg tablet Take 2.5 mg by mouth every 48 hours.   ? metoprolol succinate XL (TOPROL XL) 25 mg extended release tablet Take one tablet by mouth daily.   ? mupirocin (BACTROBAN) 2 % topical ointment Apply  topically to affected area as Needed.   ? nitroglycerin (NITROSTAT) 0.4 mg tablet Place 1 Tab under tongue every 5 minutes as needed for Chest Pain.   ? pioglitazone (ACTOS) 30 mg tablet Take 1 tablet by mouth daily.   ? potassium chloride (K-TAB) 10 mEq tablet Take one tablet by mouth daily. Take with a meal and a full glass of water.   ? senna/docusate (SENOKOT-S) 8.6/50 mg tablet Take two tablets by mouth daily. Indications: constipation   ? simvastatin (ZOCOR) 20 mg tablet TAKE ONE TABLET BY MOUTH AT BEDTIME (Patient taking differently: Take 20 mg by mouth at bedtime daily.)

## 2021-04-17 ENCOUNTER — Encounter: Admit: 2021-04-17 | Discharge: 2021-04-17 | Payer: MEDICARE

## 2021-04-17 DIAGNOSIS — E782 Mixed hyperlipidemia: Secondary | ICD-10-CM

## 2021-04-17 DIAGNOSIS — I2510 Atherosclerotic heart disease of native coronary artery without angina pectoris: Secondary | ICD-10-CM

## 2021-04-17 DIAGNOSIS — I10 Essential (primary) hypertension: Secondary | ICD-10-CM

## 2021-04-17 DIAGNOSIS — Z951 Presence of aortocoronary bypass graft: Secondary | ICD-10-CM

## 2021-04-17 LAB — BASIC METABOLIC PANEL
BLD UREA NITROGEN: 38 — ABNORMAL HIGH (ref 8.4–25.7)
CHLORIDE: 102
CO2: 26
CREATININE: 1.7 — ABNORMAL HIGH (ref 0.72–1.25)
POTASSIUM: 4.5
SODIUM: 139

## 2021-04-17 LAB — MAGNESIUM: MAGNESIUM: 2.1

## 2021-04-18 ENCOUNTER — Encounter: Admit: 2021-04-18 | Discharge: 2021-04-18 | Payer: MEDICARE

## 2021-04-18 NOTE — Telephone Encounter
-----   Message from Hester Mates, MD sent at 04/17/2021  3:40 PM CST -----  To all: Mr. Altmann's diuretic regimen has been increased by other providers and is reflected in his increased serum creatinine.  However, he feels better on this diuretic regimen.  Please recheck his Chem-7 in 1 month.  Thanks.  SBG  ----- Message -----  From: Rogelia Boga, RN  Sent: 04/17/2021   2:14 PM CST  To: Hester Mates, MD

## 2021-05-06 ENCOUNTER — Encounter: Admit: 2021-05-06 | Discharge: 2021-05-06 | Payer: MEDICARE

## 2021-05-08 ENCOUNTER — Encounter: Admit: 2021-05-08 | Discharge: 2021-05-08 | Payer: MEDICARE

## 2021-05-10 ENCOUNTER — Encounter: Admit: 2021-05-10 | Discharge: 2021-05-10 | Payer: MEDICARE

## 2021-05-11 ENCOUNTER — Encounter: Admit: 2021-05-11 | Discharge: 2021-05-11 | Payer: MEDICARE

## 2021-05-18 ENCOUNTER — Encounter: Admit: 2021-05-18 | Discharge: 2021-05-18 | Payer: MEDICARE

## 2021-05-18 DIAGNOSIS — I2510 Atherosclerotic heart disease of native coronary artery without angina pectoris: Secondary | ICD-10-CM

## 2021-05-18 DIAGNOSIS — I5032 Chronic diastolic (congestive) heart failure: Secondary | ICD-10-CM

## 2021-05-18 DIAGNOSIS — E669 Obesity, unspecified: Secondary | ICD-10-CM

## 2021-05-18 DIAGNOSIS — M1990 Unspecified osteoarthritis, unspecified site: Secondary | ICD-10-CM

## 2021-05-18 DIAGNOSIS — I10 Essential (primary) hypertension: Secondary | ICD-10-CM

## 2021-05-18 DIAGNOSIS — E119 Type 2 diabetes mellitus without complications: Secondary | ICD-10-CM

## 2021-05-18 DIAGNOSIS — E785 Hyperlipidemia, unspecified: Secondary | ICD-10-CM

## 2021-05-18 DIAGNOSIS — I4891 Unspecified atrial fibrillation: Secondary | ICD-10-CM

## 2021-05-18 DIAGNOSIS — R569 Unspecified convulsions: Secondary | ICD-10-CM

## 2021-05-18 NOTE — Progress Notes
Date of Service: 05/18/2021    Fernando [name] is a 86 y.o. male.       HPI     Fernando [name] had severe aortic valve stenosis and underwent transcatheter aortic valve replacement on 01/12/2021 receiving a 26 mm S3 valve.  This led to mild improvement in his dyspnea with exertion.  He plans on continuing attending cardiac rehab 2 days a week where he walks on the treadmill for 20 minutes and uses the NuStep machine for 20 minutes. Fernando [name] cut back on his Lasix because of frequent urination.  This led to significant weight gain and increasing edema.  He has resumed taking furosemide 40 mg twice daily and, in addition, takes metolazone 2.5 mg every other day.  He has 1 healing ulcer on his left pretibial surface that is improving with topical therapy.  Overall,?Fernando [name] reports consistent gradual improvement in his chronic venous stasis ulcers on both pretibial surfaces.  He continues topical treatment with medicated patches.?Fernando [name]?has been followed in the past for coronary artery disease,?having undergone coronary bypass surgery in 2007.??He has also been followed for permanent atrial fibrillation. ?He fell in December 2021. ?He then developed abnormalities in?balance and evaluation revealed a subdural hematoma. ?He did have a seizure. ?He was taken off his aspirin and Eliquis and surgical intervention was not required. ?He was placed on Keppra to prevent recurrent seizures.  It is possible that his Keppra has been associated with some lethargy. His CHA2DS2-VASc score is?5?for age,?diabetes, peripheral vascular disease?and hypertension.? He has had prior surgical ligation of his left atrial appendage.  Otherwise,?the patient?reports that he continues to feel well and has no complaints.??The patient reports no angina, palpitations, sensation of sustained forceful heart pounding, lightheadedness or syncope. The patient reports no myalgias, claudication, bleeding abnormalities,?or new neurologic abnormalities. On May 24, 2016?during a?routine visit,?a routine ECG revealed atrial fibrillation.?At that time he reported no palpitations or sensation of sustained forceful heart pounding.???He did?not want to consider cardioversion, antiarrhythmic therapy or arrhythmia ablation and excepted permanent atrial fibrillation.[name]?He deferred anticoagulation until November 2021 when he started apixaban.  Fernando [name] also fell while cleaning his driveway drain on Nov 02 2020. ?This required 3 staples in the back of his head. ?         Vitals:    05/18/21 0948   BP: 136/68   BP Source: Arm, Left Upper   Pulse: 72   SpO2: 96%   O2 Percent: 96 %   O2 Device: None (Room air)   PainSc: Zero   Weight: 125.6 kg (276 lb 12.8 oz)   Height: 185.4 cm (6' 1)     Body mass index is 36.52 kg/m?[name]     Past Medical History  Patient Active Problem List    Diagnosis Date Noted   ? S/p TAVR (transcatheter aortic valve replacement), bioprosthetic 01/12/2021     01/12/21 Dr. Helen Hashimoto and Dr. Riley Nearing     ? Hypokalemia 01/12/2021   ? Hypomagnesemia 01/12/2021   ? Nonrheumatic aortic valve stenosis 12/29/2020   ? Acute on chronic combined systolic and diastolic congestive heart failure, NYHA class 2 (HCC) 12/29/2020   ? Stage 3a chronic kidney disease (HCC) 12/29/2020   ? Venous stasis of lower extremity 12/29/2020   ? Pulmonary HTN (HCC) 11/24/2020   ? Chronic diastolic heart failure (HCC) 11/24/2020   ? Subdural hematoma 08/15/2020   ? Permanent atrial fibrillation (HCC) 06/29/2020   ? Osteoarthrosis, unspecified whether generalized or localized, lower leg 03/11/2012   ? Primary  localized osteoarthrosis, lower leg 12/18/2011   ? Pain in joint, lower leg 12/18/2011   ? S/P knee replacement 12/18/2011   ? Knee pain 10/25/2011   ? Osteoarthritis of knee 10/25/2011   ? 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 CAD (coronary artery disease) 11/11/2008     status post CABG on 06/18/2005 (patient received a LIMA to      LAD, reverse SVG to OM branch, reverse SVG to mid-RCA and reverse SVG to second      diagonal).  Patient is angina free and he is a functional Class I.  History of chest pain and admission in [facility] in 11/2007.  Exercise myocardial perfusion imaging study dated 11/13/2007, unremarkable for      ischemia.     ? Obesity 11/11/2008   ? Hyperlipidemia 11/11/2008   ? Hypertension, essential 11/11/2008         Review of Systems   Constitutional: Positive for malaise/fatigue and weight gain.   HENT: Negative.    Eyes: Negative.    Cardiovascular: Positive for dyspnea on exertion and leg swelling.   Respiratory: Negative.    Endocrine: Negative.    Hematologic/Lymphatic: Negative.    Skin: Negative.    Musculoskeletal: Negative.    Gastrointestinal: Negative.    Genitourinary: Negative.    Neurological: Negative.    Psychiatric/Behavioral: Negative.    Allergic/Immunologic: Negative.        Physical Exam  GENERAL: The patient is well developed, well nourished, resting comfortably and in no distress.   HEENT: No abnormalities of the visible oro-nasopharynx, conjunctiva or sclera are noted.  NECK: There is no jugular venous distension. Carotids are palpable and without bruits. There is no thyroid enlargement.  Chest: Lung fields are clear to auscultation. There are no wheezes or crackles.  CV: There is a regular rhythm. The first and second heart sounds are normal.  A soft systolic ejection murmur is heard.  There are no diastolic murmurs, gallops or rubs.  ABD: The abdomen is soft and supple with normal bowel sounds. There is no hepatosplenomegaly, ascites, tenderness, masses or bruits.  Neuro: There are no focal motor defects. Ambulation is normal. Cognitive function appears normal.  Ext: There is 1-2+ bilateral lower extremity edema  evidence of deep vein thrombosis. Peripheral pulses are satisfactory.    SKIN: There are no rashes and no cellulitis.  There is a dimel sized healing wound on the medial surface of his left lower foreleg.  PSYCH: The patient is calm, rationale and oriented.    Cardiovascular Studies  A twelve-lead ECG obtained on January 13, 2021 reveals atrial fibrillation with a heart rate of 61 bpm.  Right bundle branch block is noted.  Echo Doppler 02/17/2021.  Interpretation Summary  ?  1. A septal wall motion abnormality is noted consistent with right ventricular pressure and volume overload. ?No other regional wall motion abnormalities are identified. Overall left ventricular systolic function appears normal. The estimated left ventricular ejection fraction is 60%. Left ventricular contractility appears similar when compared with the prior echocardiogram performed on 01/13/2021.  2. Severe right ventricular enlargement with moderately reduced right ventricular systolic function.  3. Severe right atrial enlargement.  4. There is a 26 mm S3 transcatheter bioprosthetic valve present. ?There is no evidence for significant bioprosthetic aortic valve stenosis or regurgitation.  5. Severe tricuspid valve regurgitation. ?Mild mitral valve regurgitation.  6. No pericardial effusion is seen.  7. The estimated peak systolic PA pressure = 76 mmHg.  Compared with the prior study performed on 01/13/2021, tricuspid valve regurgitation now appears severe and the estimated peak systolic PA pressure has increased from 59 to 76 mmHg.    Cardiovascular Health Factors  Vitals BP Readings from Last 3 Encounters:   05/18/21 136/68   04/13/21 132/62   02/17/21 (!) 148/83     Wt Readings from Last 3 Encounters:   05/18/21 125.6 kg (276 lb 12.8 oz)   04/13/21 118.3 kg (260 lb 12.8 oz)   02/17/21 121.6 kg (268 lb)     BMI Readings from Last 3 Encounters:   05/18/21 36.52 kg/m?   04/13/21 33.48 kg/m?   02/17/21 34.41 kg/m?      Smoking Social History     Tobacco Use   Smoking Status Never   Smokeless Tobacco Never      Lipid Profile Cholesterol   Date Value Ref Range Status   03/30/2020 123  Final     HDL   Date Value Ref Range Status   03/30/2020 46  Final     LDL Date Value Ref Range Status   03/30/2020 66  Final     Triglycerides   Date Value Ref Range Status   03/30/2020 55  Final      Blood Sugar Hemoglobin A1C   Date Value Ref Range Status   01/10/2021 8.1 (H) 4.0 - 6.0 % Final     Comment:     The ADA recommends that most patients with type 1 and type 2 diabetes maintain   an A1c level <7%.       Glucose   Date Value Ref Range Status   05/08/2021 153 (H) 70 - 105 Final   04/17/2021 120 (H) 70 - 105 Final   02/17/2021 108 (H) 70 - 100 MG/DL Final   16/03/9603 540 (H) 70 - 110 MG/DL Final   98/04/9146 95 70 - 110 MG/DL Final   82/95/6213 086 (H) 70 - 110 MG/DL Final     Glucose, POC   Date Value Ref Range Status   01/13/2021 196 (H) 70 - 100 MG/DL Final   57/84/6962 952 (H) 70 - 100 MG/DL Final   84/13/2440 102 (H) 70 - 100 MG/DL Final          Problems Addressed Today  Encounter Diagnoses   Name Primary?   ? Hypertension, essential Yes   ? Coronary artery disease involving native coronary artery of native heart without angina pectoris    ? Chronic diastolic heart failure (HCC)        Assessment and Plan     Mr. Seigle  is generally doing well following transcatheter aortic valve replacement.    I reemphasized the importance of continuing his current diuretic regimen, furosemide 40 mg twice daily and metolazone 2.5 mg every other  day to control his edema.  His congestive heart failure appears very well compensated on examination today.  In addition his chronic venous stasis ulcer on his left lower foreleg appears to be healing with continued local therapy.  I would strongly recommend that Mr. Underdown continue cardiac rehabilitation and believe that it is medically necessary.  It has had a strong beneficial effect for the patient.  He was given a requisition to check his Chem-7 considering his current diuretic regimen.  I have asked him to return for follow-up in 3 months time. The total time spent during this interview and exam was 30 minutes.         Current Medications (including  today's revisions)  ? acetaminophen (TYLENOL) 325 mg tablet Take two tablets by mouth every 6 hours as needed.   ? aspirin EC (ASPIR-LOW) 81 mg tablet Take one tablet by mouth daily. Take with food.   ? furosemide (LASIX) 40 mg tablet Take 40 mg by mouth twice daily.   ? losartan (COZAAR) 100 mg tablet Take one tablet by mouth daily.   ? metOLazone (ZAROXOLYN) 2.5 mg tablet Take 2.5 mg by mouth every 48 hours.   ? metoprolol succinate XL (TOPROL XL) 25 mg extended release tablet Take one tablet by mouth daily.   ? mupirocin (BACTROBAN) 2 % topical ointment Apply  topically to affected area as Needed.   ? nitroglycerin (NITROSTAT) 0.4 mg tablet Place 1 Tab under tongue every 5 minutes as needed for Chest Pain.   ? pioglitazone (ACTOS) 30 mg tablet Take 1 tablet by mouth daily.   ? potassium chloride (K-TAB) 10 mEq tablet Take one tablet by mouth daily. Take with a meal and a full glass of water. (Patient taking differently: Take 10 mEq by mouth twice daily. Take with a meal and a full glass of water.)   ? senna/docusate (SENOKOT-S) 8.6/50 mg tablet Take two tablets by mouth daily. Indications: constipation   ? simvastatin (ZOCOR) 20 mg tablet TAKE ONE TABLET BY MOUTH AT BEDTIME (Patient taking differently: Take 20 mg by mouth at bedtime daily.)

## 2021-05-19 ENCOUNTER — Encounter: Admit: 2021-05-19 | Discharge: 2021-05-19 | Payer: MEDICARE

## 2021-05-19 ENCOUNTER — Ambulatory Visit: Admit: 2021-05-19 | Discharge: 2021-05-20 | Payer: MEDICARE

## 2021-05-19 MED ORDER — LEVETIRACETAM 500 MG PO TB24
500 mg | ORAL_TABLET | Freq: Every day | ORAL | 0 refills | 90.00000 days | Status: AC
Start: 2021-05-19 — End: ?

## 2021-05-19 NOTE — Patient Instructions
Decrease keppra XR to 500mg  for 2 weeks than discontinue thereafter. Reach out to our clinic if any seizure-like episodes recur.

## 2021-06-09 ENCOUNTER — Encounter: Admit: 2021-06-09 | Discharge: 2021-06-09 | Payer: MEDICARE

## 2021-06-09 MED ORDER — METOPROLOL SUCCINATE 25 MG PO TB24
ORAL_TABLET | Freq: Every day | 0 refills
Start: 2021-06-09 — End: ?

## 2021-06-23 ENCOUNTER — Encounter: Admit: 2021-06-23 | Discharge: 2021-06-23 | Payer: MEDICARE

## 2021-06-23 MED ORDER — FUROSEMIDE 40 MG PO TAB
ORAL_TABLET | Freq: Every morning | ORAL | 3 refills | 90.00000 days | Status: AC
Start: 2021-06-23 — End: ?
  Filled 2021-08-18: qty 60, 30d supply, fill #1

## 2021-08-11 ENCOUNTER — Encounter: Admit: 2021-08-11 | Discharge: 2021-08-11 | Payer: MEDICARE

## 2021-08-13 ENCOUNTER — Emergency Department: Admit: 2021-08-13 | Discharge: 2021-08-13 | Payer: MEDICARE

## 2021-08-13 ENCOUNTER — Encounter: Admit: 2021-08-13 | Discharge: 2021-08-13 | Payer: MEDICARE

## 2021-08-13 ENCOUNTER — Inpatient Hospital Stay: Admit: 2021-08-13 | Payer: MEDICARE

## 2021-08-13 DIAGNOSIS — I509 Heart failure, unspecified: Secondary | ICD-10-CM

## 2021-08-13 DIAGNOSIS — I10 Essential (primary) hypertension: Secondary | ICD-10-CM

## 2021-08-13 DIAGNOSIS — E669 Obesity, unspecified: Secondary | ICD-10-CM

## 2021-08-13 DIAGNOSIS — E8770 Fluid overload, unspecified: Secondary | ICD-10-CM

## 2021-08-13 DIAGNOSIS — M1990 Unspecified osteoarthritis, unspecified site: Secondary | ICD-10-CM

## 2021-08-13 DIAGNOSIS — I4891 Unspecified atrial fibrillation: Secondary | ICD-10-CM

## 2021-08-13 DIAGNOSIS — E119 Type 2 diabetes mellitus without complications: Secondary | ICD-10-CM

## 2021-08-13 DIAGNOSIS — R569 Unspecified convulsions: Secondary | ICD-10-CM

## 2021-08-13 DIAGNOSIS — I2510 Atherosclerotic heart disease of native coronary artery without angina pectoris: Secondary | ICD-10-CM

## 2021-08-13 DIAGNOSIS — E785 Hyperlipidemia, unspecified: Secondary | ICD-10-CM

## 2021-08-13 DIAGNOSIS — M7989 Other specified soft tissue disorders: Secondary | ICD-10-CM

## 2021-08-13 LAB — COMPREHENSIVE METABOLIC PANEL
ALK PHOSPHATASE: 135 U/L — ABNORMAL HIGH (ref 25–110)
ANION GAP: 14 K/UL — ABNORMAL HIGH (ref 3–12)
BLD UREA NITROGEN: 82 mg/dL — ABNORMAL HIGH (ref 7–25)
CREATININE: 2.3 mg/dL — ABNORMAL HIGH (ref 0.4–1.24)
EGFR: 26 mL/min — ABNORMAL LOW (ref >60)
GLUCOSE,PANEL: 169 mg/dL — ABNORMAL HIGH (ref 70–100)
POTASSIUM: 4.1 MMOL/L — ABNORMAL LOW (ref 3.5–5.1)
TOTAL BILIRUBIN: 1.4 mg/dL — ABNORMAL HIGH (ref 0.3–1.2)
TOTAL PROTEIN: 8 g/dL — ABNORMAL LOW (ref 6.0–8.0)

## 2021-08-13 LAB — CBC AND DIFF
ABSOLUTE BASO COUNT: 0 K/UL (ref 0–0.20)
ABSOLUTE EOS COUNT: 0.1 K/UL (ref 0–0.45)
ABSOLUTE MONO COUNT: 0.7 K/UL (ref 0–0.80)
BASOPHILS %: 1 % (ref 0–2)
EOSINOPHILS %: 3 % (ref 0–5)
HEMATOCRIT: 37 % — ABNORMAL LOW (ref 40–50)
MDW (MONOCYTE DISTRIBUTION WIDTH): 17 (ref <20.7)
MONOCYTES %: 13 % — ABNORMAL HIGH (ref 4–12)
NEUTROPHILS %: 70 % (ref 41–77)
RBC COUNT: 3.6 M/UL — ABNORMAL LOW (ref 4.4–5.5)
RDW: 15 % — ABNORMAL HIGH (ref 11–15)
WBC COUNT: 5.5 K/UL — ABNORMAL LOW (ref 4.5–11.0)

## 2021-08-13 LAB — BNP POC ER: BNP POC: 144 pg/mL — ABNORMAL HIGH (ref 0–100)

## 2021-08-13 LAB — POC TROPONIN: TROPONIN I, POC: 0 ng/mL (ref 0.00–0.05)

## 2021-08-13 MED ORDER — ONDANSETRON 4 MG PO TBDI
4 mg | ORAL | 0 refills | PRN
Start: 2021-08-13 — End: ?

## 2021-08-13 MED ORDER — ONDANSETRON HCL (PF) 4 MG/2 ML IJ SOLN
4 mg | INTRAVENOUS | 0 refills | PRN
Start: 2021-08-13 — End: ?

## 2021-08-13 MED ORDER — ASPIRIN 81 MG PO TBEC
81 mg | Freq: Every day | ORAL | 0 refills
Start: 2021-08-13 — End: ?
  Administered 2021-08-14 – 2021-08-18 (×5): 81 mg via ORAL

## 2021-08-13 MED ORDER — DEXTROSE 50 % IN WATER (D50W) IV SYRG
12.5-25 g | INTRAVENOUS | 0 refills | PRN
Start: 2021-08-13 — End: ?

## 2021-08-13 MED ORDER — POLYETHYLENE GLYCOL 3350 17 GRAM PO PWPK
1 | Freq: Every day | ORAL | 0 refills | PRN
Start: 2021-08-13 — End: ?
  Administered 2021-08-16 – 2021-08-18 (×3): 17 g via ORAL

## 2021-08-13 MED ORDER — METOPROLOL SUCCINATE 50 MG PO TB24
25 mg | Freq: Every day | ORAL | 0 refills
Start: 2021-08-13 — End: ?
  Administered 2021-08-15 – 2021-08-16 (×2): 25 mg via ORAL

## 2021-08-13 MED ORDER — SENNOSIDES-DOCUSATE SODIUM 8.6-50 MG PO TAB
1 | Freq: Every day | ORAL | 0 refills | PRN
Start: 2021-08-13 — End: ?

## 2021-08-13 MED ORDER — FUROSEMIDE 10 MG/ML IJ SOLN
80 mg | Freq: Once | INTRAVENOUS | 0 refills | Status: CP
Start: 2021-08-13 — End: ?
  Administered 2021-08-14: 03:00:00 80 mg via INTRAVENOUS

## 2021-08-13 MED ORDER — FUROSEMIDE 10 MG/ML IJ SOLN
60 mg | Freq: Two times a day (BID) | INTRAVENOUS | 0 refills
Start: 2021-08-13 — End: ?
  Administered 2021-08-14 – 2021-08-15 (×3): 60 mg via INTRAVENOUS

## 2021-08-13 MED ORDER — HEPARIN, PORCINE (PF) 5,000 UNIT/0.5 ML IJ SYRG
5000 [IU] | SUBCUTANEOUS | 0 refills
Start: 2021-08-13 — End: ?
  Administered 2021-08-14 – 2021-08-18 (×8): 5000 [IU] via SUBCUTANEOUS

## 2021-08-13 MED ORDER — ACETAMINOPHEN 325 MG PO TAB
650 mg | ORAL | 0 refills | PRN
Start: 2021-08-13 — End: ?

## 2021-08-13 MED ORDER — INSULIN ASPART 100 UNIT/ML SC FLEXPEN
0-12 [IU] | Freq: Before meals | SUBCUTANEOUS | 0 refills
Start: 2021-08-13 — End: ?
  Administered 2021-08-14 (×2): 2 [IU] via SUBCUTANEOUS

## 2021-08-13 MED ORDER — METOLAZONE 5 MG PO TAB
2.5 mg | ORAL | 0 refills
Start: 2021-08-13 — End: ?
  Administered 2021-08-14 – 2021-08-18 (×3): 2.5 mg via ORAL

## 2021-08-13 MED ORDER — MELATONIN 5 MG PO TAB
5 mg | Freq: Every evening | ORAL | 0 refills | PRN
Start: 2021-08-13 — End: ?

## 2021-08-13 MED ORDER — LOSARTAN 50 MG PO TAB
100 mg | Freq: Every day | ORAL | 0 refills
Start: 2021-08-13 — End: ?
  Administered 2021-08-14: 16:00:00 100 mg via ORAL

## 2021-08-14 ENCOUNTER — Encounter: Admit: 2021-08-14 | Discharge: 2021-08-14 | Payer: MEDICARE

## 2021-08-14 NOTE — ED Notes
ANDY BIBEAULT is a 87 yo male presenting to ED 24 with CC of groin pain. Pt reports increased scrotal swelling with difficulty urinating. Pt reports hx of heart failure, states he has had recent weight gain of 20 lbs over the last two weeks. Pt noted to have bilateral lower extremity edema and scrotal edema. Pt resting comfortably on locked cart, call light in reach.     Medical History:   Diagnosis Date    Arthritis     Atrial fibrillation (HCC)     CAD (coronary artery disease) 11/11/2008    DM (diabetes mellitus) (HCC)     Hyperlipidemia 11/11/2008    Hypertension 11/11/2008    Obesity 11/11/2008    Seizure (HCC)

## 2021-08-15 ENCOUNTER — Inpatient Hospital Stay: Admit: 2021-08-15 | Discharge: 2021-08-15 | Payer: MEDICARE

## 2021-08-15 MED ADMIN — POTASSIUM CHLORIDE 20 MEQ PO TBTQ [35943]: 40 meq | ORAL | @ 20:00:00 | Stop: 2021-08-15 | NDC 00832532511

## 2021-08-15 MED ADMIN — SENNOSIDES-DOCUSATE SODIUM 8.6-50 MG PO TAB [40926]: 1 | ORAL | @ 20:00:00 | NDC 00536124801

## 2021-08-15 MED ADMIN — PERFLUTREN LIPID MICROSPHERES 1.1 MG/ML IV SUSP [79178]: 2 mL | INTRAVENOUS | @ 17:00:00 | Stop: 2021-08-15 | NDC 11994001116

## 2021-08-15 MED ADMIN — FUROSEMIDE 10 MG/ML IJ SOLN [3291]: 60 mg | INTRAVENOUS | @ 23:00:00 | NDC 63323028001

## 2021-08-16 MED ADMIN — POTASSIUM CHLORIDE 20 MEQ PO TBTQ [35943]: 40 meq | ORAL | @ 16:00:00 | Stop: 2021-08-16 | NDC 00832532511

## 2021-08-16 MED ADMIN — FUROSEMIDE 10 MG/ML IJ SOLN [3291]: 60 mg | INTRAVENOUS | @ 21:00:00 | NDC 63323028001

## 2021-08-16 MED ADMIN — FUROSEMIDE 10 MG/ML IJ SOLN [3291]: 60 mg | INTRAVENOUS | @ 15:00:00 | NDC 63323028001

## 2021-08-16 MED ADMIN — SENNOSIDES-DOCUSATE SODIUM 8.6-50 MG PO TAB [40926]: 1 | ORAL | @ 15:00:00 | NDC 00536124801

## 2021-08-16 MED ADMIN — FUROSEMIDE 10 MG/ML IJ SOLN [3291]: 60 mg | INTRAVENOUS | @ 04:00:00 | NDC 70860030241

## 2021-08-16 MED ADMIN — SENNOSIDES-DOCUSATE SODIUM 8.6-50 MG PO TAB [40926]: 1 | ORAL | @ 04:00:00 | NDC 00536124801

## 2021-08-17 MED ADMIN — SENNOSIDES-DOCUSATE SODIUM 8.6-50 MG PO TAB [40926]: 1 | ORAL | @ 15:00:00 | NDC 00536124801

## 2021-08-17 MED ADMIN — POTASSIUM CHLORIDE 20 MEQ PO TBTQ [35943]: 40 meq | ORAL | @ 19:00:00 | Stop: 2021-08-17 | NDC 00832532511

## 2021-08-17 MED ADMIN — POTASSIUM CHLORIDE 20 MEQ PO TBTQ [35943]: 20 meq | ORAL | @ 23:00:00 | Stop: 2021-08-17 | NDC 00832532511

## 2021-08-17 MED ADMIN — SPIRONOLACTONE 25 MG PO TAB [7437]: 12.5 mg | ORAL | @ 22:00:00 | NDC 00904692761

## 2021-08-17 MED ADMIN — METOLAZONE 2.5 MG PO TAB [10587]: 5 mg | ORAL | @ 03:00:00 | Stop: 2021-08-18 | NDC 00185505001

## 2021-08-17 MED ADMIN — FUROSEMIDE 10 MG/ML IJ SOLN [3291]: 60 mg | INTRAVENOUS | @ 03:00:00 | NDC 63323028001

## 2021-08-17 MED ADMIN — POTASSIUM CHLORIDE 20 MEQ PO TBTQ [35943]: 20 meq | ORAL | @ 03:00:00 | Stop: 2021-08-17 | NDC 00832532511

## 2021-08-17 MED ADMIN — FUROSEMIDE 10 MG/ML IJ SOLN [3291]: 60 mg | INTRAVENOUS | @ 21:00:00 | NDC 63323028001

## 2021-08-17 MED ADMIN — FUROSEMIDE 10 MG/ML IJ SOLN [3291]: 60 mg | INTRAVENOUS | @ 14:00:00 | NDC 70860030241

## 2021-08-17 MED ADMIN — POLYETHYLENE GLYCOL 3350 17 GRAM PO PWPK [25424]: 119 g | ORAL | @ 23:00:00 | Stop: 2021-08-17 | NDC 00904693186

## 2021-08-17 MED ADMIN — METOLAZONE 5 MG PO TAB [10588]: 5 mg | ORAL | @ 15:00:00 | Stop: 2021-08-17 | NDC 51079002401

## 2021-08-17 MED ADMIN — SENNOSIDES-DOCUSATE SODIUM 8.6-50 MG PO TAB [40926]: 1 | ORAL | @ 03:00:00 | NDC 00536124801

## 2021-08-18 ENCOUNTER — Encounter: Admit: 2021-08-18 | Discharge: 2021-08-18 | Payer: MEDICARE

## 2021-08-18 MED ADMIN — FUROSEMIDE 10 MG/ML IJ SOLN [3291]: 60 mg | INTRAVENOUS | @ 16:00:00 | Stop: 2021-08-18 | NDC 63323028001

## 2021-08-18 MED ADMIN — SENNOSIDES-DOCUSATE SODIUM 8.6-50 MG PO TAB [40926]: 1 | ORAL | @ 02:00:00 | NDC 00536124801

## 2021-08-18 MED ADMIN — FUROSEMIDE 10 MG/ML IJ SOLN [3291]: 60 mg | INTRAVENOUS | @ 02:00:00 | NDC 63323028001

## 2021-08-18 MED ADMIN — POTASSIUM CHLORIDE 20 MEQ PO TBTQ [35943]: 40 meq | ORAL | @ 16:00:00 | Stop: 2021-08-18 | NDC 00832532511

## 2021-08-18 MED ADMIN — SENNOSIDES-DOCUSATE SODIUM 8.6-50 MG PO TAB [40926]: 1 | ORAL | @ 16:00:00 | Stop: 2021-08-18 | NDC 00536124801

## 2021-08-18 MED ADMIN — SPIRONOLACTONE 25 MG PO TAB [7437]: 12.5 mg | ORAL | @ 16:00:00 | Stop: 2021-08-18 | NDC 00904692761

## 2021-08-18 MED ADMIN — METOLAZONE 2.5 MG PO TAB [10587]: 2.5 mg | ORAL | @ 02:00:00 | Stop: 2021-08-19 | NDC 00185505001

## 2021-08-18 MED ADMIN — METOLAZONE 2.5 MG PO TAB [10587]: 2.5 mg | ORAL | @ 16:00:00 | Stop: 2021-08-18 | NDC 00185505001

## 2021-08-18 MED FILL — SPIRONOLACTONE 25 MG PO TAB: 25 mg | ORAL | 30 days supply | Qty: 30 | Fill #1 | Status: CP

## 2021-08-22 ENCOUNTER — Encounter: Admit: 2021-08-22 | Discharge: 2021-08-22 | Payer: MEDICARE

## 2021-08-22 ENCOUNTER — Ambulatory Visit: Admit: 2021-08-22 | Discharge: 2021-08-22 | Payer: MEDICARE

## 2021-08-22 DIAGNOSIS — I272 Pulmonary hypertension, unspecified: Secondary | ICD-10-CM

## 2021-08-22 DIAGNOSIS — M1990 Unspecified osteoarthritis, unspecified site: Secondary | ICD-10-CM

## 2021-08-22 DIAGNOSIS — Z951 Presence of aortocoronary bypass graft: Secondary | ICD-10-CM

## 2021-08-22 DIAGNOSIS — E119 Type 2 diabetes mellitus without complications: Secondary | ICD-10-CM

## 2021-08-22 DIAGNOSIS — I2510 Atherosclerotic heart disease of native coronary artery without angina pectoris: Secondary | ICD-10-CM

## 2021-08-22 DIAGNOSIS — I10 Essential (primary) hypertension: Secondary | ICD-10-CM

## 2021-08-22 DIAGNOSIS — N1831 Stage 3a chronic kidney disease (HCC): Secondary | ICD-10-CM

## 2021-08-22 DIAGNOSIS — I350 Nonrheumatic aortic (valve) stenosis: Secondary | ICD-10-CM

## 2021-08-22 DIAGNOSIS — E669 Obesity, unspecified: Secondary | ICD-10-CM

## 2021-08-22 DIAGNOSIS — I4891 Unspecified atrial fibrillation: Secondary | ICD-10-CM

## 2021-08-22 DIAGNOSIS — Z953 Presence of xenogenic heart valve: Secondary | ICD-10-CM

## 2021-08-22 DIAGNOSIS — I5032 Chronic diastolic (congestive) heart failure: Secondary | ICD-10-CM

## 2021-08-22 DIAGNOSIS — R569 Unspecified convulsions: Secondary | ICD-10-CM

## 2021-08-22 DIAGNOSIS — E785 Hyperlipidemia, unspecified: Secondary | ICD-10-CM

## 2021-08-22 DIAGNOSIS — T148XXA Other injury of unspecified body region, initial encounter: Secondary | ICD-10-CM

## 2021-08-22 MED ORDER — LOSARTAN 25 MG PO TAB
25 mg | ORAL_TABLET | Freq: Every day | ORAL | 3 refills | 90.00000 days | Status: AC
Start: 2021-08-22 — End: ?

## 2021-08-22 NOTE — Telephone Encounter
08/22/2021 1:51 PM At  OV today, 08/22/21, Ginger, NP placed a referral for wound care for patient's non-healing ulcer of left shin.  Patient lives in [place] and has followed with [name] in the past.    Called patient's PCP, Dr. Andreas Newport, and left a message to request their assistance in facilitating the wound care referral.  Received voicemail from [facility] in Dr. Gilles Chiquito office asking for clarification of where patient would like to receive would care, at [facility] or in [place].  Called Becky back at [phone] and left a voicemail requesting patient follow with [facility] at [facility], as that is close to patient's home.

## 2021-08-22 NOTE — Progress Notes
Date of Service: 08/22/2021    Fernando [name] is a 87 y.o. male.       HPI     Fernando [name] was seen in our office today for post hospital heart failure follow-up visit.  He is accompanied today by his son, Thayer Ohm.  He is a 87 year old male followed in our office by Dr. Marlane Hatcher.  The patient has a medical history significant for bsevere aortic valve stenosis and underwent transcatheter aortic valve replacement on 01/12/2021 receiving a #26 mm S3 valve.  This led to mild improvement in his dyspnea with exertion.  His medical history also includes chronic diastolic heart failure/HFrEF, pulmonary hypertension, coronary artery disease s/p CABG 06/18/2005 (LIMA to LAD, reverse SVG to OM branch, reverse SVG to mid RCA and reverse SVG to second diagonal), CKD stage III, DM type 2, permanent atrial fibrillation s/p ligation of LAA not on anticoagulation with recurrent falls, right bundle branch block, tricuspid valve regurgitation, obesity, hyperlipidemia, hypertension, history of subdural hematoma in December 2021 after a fall, venous stasis ulcers, osteoarthritis of the knees status post knee replacement surgery.  He tested positive for COVID-19 during his recent hospitalization this month (March 2023).      Atrial fibrillation was diagnosed during a routine visit on 05/24/2016, a routine EKG revealed atrial fibrillation.  At that time he reported no palpitations or sensation of sustained forceful heart pounding.  He did not want to consider cardioversion, antiarrhythmic therapy or arrhythmia ablation and excepted to permanent atrial fibrillation.  Deferred anticoagulation until November 2021 when he started apixaban.   He fell in December 2021.  He then developed abnormalities in balance and evaluation revealed a subdural hematoma.  He did have a seizure.  He was taken off his aspirin and Eliquis and surgical intervention was not required.  He was placed on Keppra to prevent recurrent seizures. His CHA2DS2-VASc score is 5 for age, diabetes, peripheral vascular disease and hypertension.  He has had prior surgical ligation of his left atrial appendage.     He did undergo right and left heart catheterization on 11/30/2020 in anticipation of TAVR.  Notes are outlined below.  Findings revealed elevated right and left-sided filling pressures, severely elevated pulmonary artery pressure, normal cardiac output and cardiac index, severe three-vessel native CAD with widely patent grafts including LIMA to the LAD , SVG to diagonal, SVG to marginal artery and SVG graft to RCA with negative resting full cycle ratio of moderate disease in the SVG to the marginal artery.    He was last seen in our office on 05/18/2021 by Dr. Arna Medici.  Reviewed today indicate the patient was doing well and was well compensated from a CHF standpoint.  He was advised to continue on his current diuretics including Lasix 40 mg twice daily metolazone 2.5 mg every other day.  He was recommended to continue cardiac rehab as this had a strong beneficial effect for the patient.  He was asked to get a basic metabolic panel checked.  He was asked to return for a follow-up visit in 3 months.    Unfortunately patient has had recent hospital admission from 3/5 - 08/18/2021 at [facility] with acute on chronic HFpEF. He had progressively worsening exertional dyspnea over the past several months with worsening lower extremity edema in the past 2 weeks and weight gain of 20 pounds.  He also noted scrotal swelling.  He was COVID-positive on admit. Treatment was not pursued as he was completely asymptomatic  on room air. . Patient reported missing afternoon doses of Lasix occasionally and was drinking quite a bit of fluids as well as high sodium diet.  Blood pressure was high as 178/78 on admission.  Cardiology was consulted on 08/14/2021 for HFpEF exacerbation. He was given 80 mg IV Lasix on 3/5.  On 3/7 he received Lasix 60 mg IV 3 times daily.  He received metolazone 2.5 mg on 3/6, 2.5 mg and 5 mg on 3/8 and 3/9 and 2.5 mg x 1 on 3/10.     Echo on 3/7 revealed EF 60 to 65%.  Mild to moderate RV dysfunction.  Elevated E/e', severe TR. Pulmonary hypertension with PASP 76 mmHg.  Limited view of AV but appears to be stable without evidence of valvular stenosis or regurgitation.  Did have AKI during his hospital admission, creatinine 2.36 on admission (baseline 1.3-1.6), initially improved with diuresis.  Overall creatinine somewhat improved.  Weight down 27 pounds this admit with diuresis.  He was transitioned from IV Lasix 60 mg ID to oral PTA furosemide 40 mg twice daily along with PTA metolazone 2.5 mg Monday Wednesday and Friday.  He was advised to have a basic metabolic panel checked in 1 week.  A repeat echocardiogram recommended to be done in the outpatient setting once patient euvolemic to reassess his severe TR. They discussed with case management, and Jardiance cost for patient would be $345 per month.  He was noted to have anemia and thrombocytopenia which have been present in the past.  This remained stable.  He also has hyperbilirubinemia noted on admission which has also been stable.  Weight on admission was 270 pounds and on the day of discharge 246 pounds.  Due to significant volume overload and lower extremity edema he did have minimal skin breakdown present on admission and evidence of venous stasis. Discharged on spironolactone 12.5 mg twice daily (started 08/17/2021) with plans to uptitrate further as tolerated.  PTA losartan was held given marginal blood pressure with consideration for reinitiation patient setting given diabetes mellitus.  PTA metoprolol XL 25 mg was restarted prior to discharge (this was held during his hospital stay due to slow heart rate). PTA potassium was stopped.  He was advised outpatient sleep study.  A referral was placed to [place] weight management clinic on discharge.    Today the patient reports that he is feeling much better than when he went into the hospital.  He had had weight gain and lower extremity edema prior to his recent hospital admission.  His weight is back down.  He does still have some lower extremity edema which is mild and stable.  His swelling does go down somewhat overnight but not completely.  He has significantly reduced his salt intake.  Prior to admission he was getting more salt in his diet than he should which contributed to the problem.  He had also previously cut back on his Lasix due to the inconvenience of frequent urination.  These things led to significant weight gain and increasing edema. He does live independently at home and does his own cooking and eats out a lot.  Since he received education on a low-sodium diet he has now stopped adding extra salt to his food and the dietitian has helped him to order some prepackaged meals that are low in sodium.  He understands that when he eats out he needs to select low-sodium food choices.  We did have a lengthy discussion with the patient and his son today counseling  on a low-salt diet.  He reports home scale weight stable at 246 pounds since discharge which correlates with a weight in our office today of 249 pounds.  His discharge weight on 3/10 was 246.6 and we are considering his dry weight to be around 250 pounds.  Current diuretics include Lasix 40 mg twice daily, spironolactone 12.5 mg twice daily which was started prior to hospital discharge and metolazone 2.5 mg times weekly (Monday, Wednesday and Friday).  I instructed patient to take the metolazone 30 minutes prior to his Lasix.  He tells me he periodically checks his blood pressure at home it usually runs in the upper 130s to 140s systolic, 70s diastolic.  In our office today his blood pressure is 126/50, pulse 56 bpm.  He does go to cardiac rehab at [facility] in [facility] twice a week and tells me that his blood pressure is checked there and it is usually lower than what he gets at home.  He also exercises at home on the treadmill for 10 to 15 minutes 2 to 3 days a week.  TLosartan was held during his recent hospitalization due to low blood pressure.  He tells me his breathing is good he denies shortness of breath at rest and dyspnea on exertion.  Denies having chest pain, exertional chest pain, PND and orthopnea.  He denies abdominal bloating.  He denies palpitations.  He tells me that he does have some brief episodes of positional lightheadedness from time to time but these are not bothersome to him.  He has had no near-syncope or syncopal episodes.  He denies falls.  He denies having myalgias, claudication and TIA  Or CVA type signs/symptoms.  Also denies fever, chills, night sweats and cough.    Cardiovascular Studies  2D echo Doppler 08/15/2021  ? Normal left ventricular size.  There is mildly increased left ventricular wall thickness. The left ventricular systolic function is normal with an estimated ejection fraction of 60 to 65%.  Abnormal septal motion consistent with RV pressure and volume overload.  Unable to assess severity of diastolic dysfunction, but elevated E/e' suggestive of increased left atrial pressure.  ? Right ventricle is severely dilated with mild to moderate systolic dysfunction.  ? The left atrium is mildly dilated.  The right atrium is severely dilated.  ? There is a 26 mm S3 bioprosthetic valve in the aortic position.  Limited views of the aortic valve leaflets suggest that they are moving appropriately.  No evidence of valvular stenosis or regurgitation.  ? Severe tricuspid regurgitation with evidence of hepatic vein systolic flow reversal likely secondary to a dilated annulus.  ? Moderate pulmonic valve regurgitation.  ? Severely increased RA pressure estimated to be 15 mmHg.  Severely elevated pulmonary arterial systolic pressure that is estimated to be 76 mmHg.  Estimated pulmonary arterial diastolic pressure is 22 mmHg.  ? No pericardial effusion.    Echo Doppler 02/17/2021.  1. A septal wall motion abnormality is noted consistent with right ventricular pressure and volume overload.  No other regional wall motion abnormalities are identified. Overall left ventricular systolic function appears normal. The estimated left ventricular ejection fraction is 60%. Left ventricular contractility appears similar when compared with the prior echocardiogram performed on 01/13/2021.  2. Severe right ventricular enlargement with moderately reduced right ventricular systolic function.  3. Severe right atrial enlargement.  4. There is a 26 mm S3 transcatheter bioprosthetic valve present.  There is no evidence for significant bioprosthetic aortic valve stenosis or  regurgitation.  5. Severe tricuspid valve regurgitation.  Mild mitral valve regurgitation.  6. No pericardial effusion is seen.  7. The estimated peak systolic PA pressure = 76 mmHg.  Compared with the prior study performed on 01/13/2021, tricuspid valve regurgitation now appears severe and the estimated peak systolic PA pressure has increased from 59 to 76 mmHg.    Left and Right Heart Catheterization 11/30/20  FINDINGS:  HEMODYNAMICS:  1. RA pressure 17 mmHg with a V-wave of 30 mmHg.  2. RV pressure 90/12 mmHg with RVEDP of 20 mmHg.  3. PA pressure 90/33 mmHg with a mean PA of 52 mmHg.  4. Pulmonary capillary wedge pressure of 25 mmHg with a V-wave of 27 mmHg.  5. PA sat 70%, RA sat 70%, arterial sat 98%.  6. Hemoglobin 13.7, BSA 2.41, blood pressure 220/116 mmHg, heart rate 61 beats per minute.  7. Cardiac output 5.67 L/minute by Fick and 4.5 L/minute by thermo.  8. Cardiac index 2.35 L/minute per square meter by Fick and 1.86 L/minute per square meter by thermo.      CORONARY ANGIOGRAPHY:  1. Coronary anatomy was right dominant.  2. The left main coronary artery has mild disease.  3. The LAD is 100% occluded in its proximal to midportion.  4. The circumflex artery has severe 90% stenosis in its proximal to midportion.  The marginal artery fills via competitive flow from patent SVG graft.  5. The right coronary artery is subtotally occluded in its midportion with 95% stenosis.  There is competitive flow from a patent SVG graft to the mid and distal RCA.   SVG GRAFT AND LIMA GRAFT ANGIOGRAPHY:  1. LIMA to the LAD is widely patent with no significant disease distal to insertion.  LAD has mild disease.  2. SVG graft to diagonal artery is patent with no significant disease.  The diagonal artery distal to SVG graft is relatively small vessel with no significant disease.  3. SVG graft to marginal artery is patent with 50% to 60% stenosis in its proximal portion.  The marginal artery distal to insertion of graft has no significant disease.  4. SVG graft to mid RCA is widely patent with no significant disease.  RCA distal to insertion of the graft as well as RPDA and RPLV have minimal disease.  5. RFR of the SVG graft to marginal artery was performed.  A JR4 guide catheter was used to engage the graft and RFR wire was advanced across the lesion.  RFR was 0.99, which indicates no evidence of hemodynamically significant stenosis in that graft.  CONCLUSIONS:    1. Elevated right and left-sided filling pressures.  2. Severely elevated pulmonary artery pressures.  3. Normal cardiac output and cardiac index.  4. Severe 3-vessel obstructive native coronary artery disease.  5. Widely patent grafts, including left internal mammary artery to the left anterior descending, saphenous vein graft to diagonal artery, saphenous vein graft to marginal artery, and saphenous vein graft to right coronary artery; with negative resting full-cycle ratio of moderate disease in saphenous vein graft to marginal artery as described above.    Assessment and Plan:   1.HFpEF/predominantly RV failure. Ejection fraction 60-65% (TTE 08/15/21).  He does have mild to moderately reduced RV systolic function, severe tricuspid valve regurgitation and pulmonary hypertension. Describes NYHA class II.  His weight is stable.  Home scale weight is at his discharge weight of 246 pounds.  He appears near euvolemic on furosemide 40 mg twice daily, metolazone 2.5 mg 3  times weekly ( MWF) and spironolactone 12.5 mg twice daily.  Emphasized importance of continuing on his diuretic regimen to control his edema and contains stable volume.  His congestive heart failure appears compensated on examination today.  Consider adding SGLT2 inhibitor at future visit if it is not cost prohibitive.  With the addition of SGLT2 inhibitor we can cut back on metolazone as well as loop diuretics eventually.  During his recent hospitalization social worker did look into coverage of Jardiance for this patient.  Notes indicate that [place] [facility] is prior authorized and will cost  $345 a month.  He is normotensive.  Laboratories acceptable when last assessed on 08/18/2021 with sodium 139, potassium 3.8, creatinine 1.9.  Creatinine above baseline but trending down.  We will repeat laboratories.  I talked to the patient today about getting a sleep study to assess for obstructive sleep apnea.  He declines and tells me he is not interested in having a sleep study done.    2. History of severe aortic stenosis s/p transcatheter aortic valve replacement [facility] TAVR)  01/12/2021 receiving a 26 mm S3 valve.  Echo showed normal functioning TAVR valve with no evidence for significant bioprosthetic valve stenosis or regurgitation.    3. Coronary artery disease, s/p CABG in 06/2005.  He denies chest pain or anginal symptoms.  Heart cath in June 2022 as noted above with patent grafts.  He not having chest pain or angina symptoms.  Will continue aspirin, statin and beta-blocker.    4. Permanent atrial fibrillation.  S/p LAA ligation in 2007.  CHADS2VASc score 5 (age, hypertension, diabetes, peripheral vascular disease). Not on AC due to frequent falls resulting in Subdural hematoma.Consider  WATCHMAN in future.     5. Severe tricuspid valve regurgitation. Echo 08/15/21 Severe tricuspid regurgitation with evidence of hepatic vein systolic flow reversal likely secondary to a dilated annulus.  This echo was in the setting of acute HF.    6. Moderate pulmonic valve regurgitation    7. Pulmonary hypertension.  PASP 76 mmHg on most recent echo dated 08/15/2021    8. Right bundle branch block.    9.  CKD episode AKI during recent hospitalization.  Creatinine 2.36 on admission.  Baseline creatinine 1.6.  AKI improved initially with diuresis.  Suspect component of cardiorenal syndrome    10. Hypertension.  Controlled.  He is not having low blood pressure since discharge.  I am going to have him restart losartan at reduced dose of 25 mg daily, PTA dose was 100 mg daily.  We may uptitrate the dose as his blood pressure and renal function allow.    11. Hyperlipidemia, treated and controlled on simvastatin 20 mg daily. A lipid profile checked on 08/14/21 shows total cholesterol 92, triglycerides 61, HDL 34, LDL 45.[name]  LDL at goal.     12. Type 2 diabetes mellitus.  A1c 7.9%.  Currently on insulin.    13. COVID-19 positive, asymptomatic.  He tested positive for COVID during recent hospitalization.  Currently not requiring any treatments.    14. Morbid obesity with BMI 32.0.    15.  Relative bradycardia.  Heart rate today 56 bpm.  We will continue current dose of metoprolol XL 25 mg daily and continue to monitor.    16.Chronic venous stasis ulcer left foreleg/in- does not appear to be healing with local at home therapy.  I think patient will benefit from wound care.  He also has a small right foreleg skin ulcer appears to be healing well  with scab.      -Restart losartan at reduced dose of 25 mg daily entheses PTA dose was 100 mg daily).  -Continue furosemide 40 mg twice daily  - Continue metolazone 2.5 mg MWF  -Consider initiation of Jardiance 10 mg daily at future visit (eGFR is 34) if not cost prohibitive.  No history of UTI or yeast infection.    - Continue metoprolol XL 25 mg daily.  -Check BMP 1 week, 08/29/2021.  -Referral placed to wound care at [facility] in [place], [place] to treat his left in skin ulcer.  - Discussed with patient the importance of compliance with diuretics, low-salt diet and fluid restriction.  -Repeat echo once patient is euvolemic to reassess severe TR-will defer timing of next echo to Dr. Arna Medici.  May also consider TEE versus transthoracic echo evaluate the valve.  -Recommend sleep study, however patient declines.  - Recommend weight reduction.  Weight loss would be beneficial long-term.  I discussed with patient about weight management clinic at [facility].  Patient is not interested in doing this but agrees to try to work harder on weight reduction through diet and exercise.  - Suggest bilateral lower extremity compression stockings and elevate legs frequently throughout the day to help with lower extremity edema.  - 2 g sodium restricted diet  - 2 L fluid restriction  -Check, blood pressure and pulse daily and keep a log to bring to each visit  - Instructed to call office for weight gain of 3 pounds overnight or 5 pounds in 1 week or goes above 252 pounds on home scale (baseline is 246-247 pounds).  - Instructed to call office if blood pressures consistently above 135 or below 100 systolic or above 85 or below 50 diastolic.  -Close follow up in our office    Follow-up : Dr.Gollub as scheduled on 09/07/2021 in our [place] clinic.  Patient encouraged to contact our office if he has problems prior to next visit.    I have educated the patient on the plan of care today.  Patient verbally expressed understanding and agreement with the plan.  Instructions are outlined in the after visit summary document.     Thank you for the opportunity to participate in the care of this patient.  Please denies it to contact me with any questions.     I spent 60 minutes on this encounter including time for chart review including hospital records,  physical exam, assessment, treatment formation and documentation.  I reviewed and  discussed her symptoms, reviewed medications, medication interactions, treatment options, diet/sodium restriction, fluid restriction, exercise,blood pressure monitoring /goals, reviewed s/s of fluid buildup/volume overload,  exercise, reviewed heart failure zones, follow up plan. Reviewed EMR including but not limited to  provider notes, most recent laboratory, radiologic and cardiovascular test results.  All questions answered to the patient's satisfaction.                      DRB  Vitals:    08/22/21 1045   BP: 126/50   BP Source: Arm, Left Upper   Pulse: 56   SpO2: 97%   PainSc: Zero   Weight: 113.1 kg (249 lb 6.4 oz)   Height: 188 cm (6' 2)     Body mass index is 32.02 kg/m?[name]     Past Medical History  Patient Active Problem List    Diagnosis Date Noted   ? Heart failure (HCC) 08/13/2021   ? S/p TAVR (transcatheter aortic valve replacement), bioprosthetic  01/12/2021     01/12/21 Dr. Helen Hashimoto and Dr. Riley Nearing     ? Hypokalemia 01/12/2021   ? Hypomagnesemia 01/12/2021   ? Nonrheumatic aortic valve stenosis 12/29/2020   ? Acute on chronic combined systolic and diastolic congestive heart failure, NYHA class 2 (HCC) 12/29/2020   ? Stage 3a chronic kidney disease (HCC) 12/29/2020   ? Venous stasis of lower extremity 12/29/2020   ? Pulmonary HTN (HCC) 11/24/2020   ? Chronic diastolic heart failure (HCC) 11/24/2020   ? Subdural hematoma 08/15/2020   ? Permanent atrial fibrillation (HCC) 06/29/2020   ? Osteoarthrosis, unspecified whether generalized or localized, lower leg 03/11/2012   ? Primary localized osteoarthrosis, lower leg 12/18/2011   ? Pain in joint, lower leg 12/18/2011   ? S/P knee replacement 12/18/2011   ? Knee pain 10/25/2011   ? Osteoarthritis of knee 10/25/2011   ? 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 CAD (coronary artery disease) 11/11/2008     status post CABG on 06/18/2005 (patient received a LIMA to      LAD, reverse SVG to OM branch, reverse SVG to mid-RCA and reverse SVG to second      diagonal).  Patient is angina free and he is a functional Class I.  History of chest pain and admission in [facility] in 11/2007.  Exercise myocardial perfusion imaging study dated 11/13/2007, unremarkable for      ischemia.     ? Obesity 11/11/2008   ? Hyperlipidemia 11/11/2008   ? Hypertension, essential 11/11/2008         Review of Systems   Constitutional: Negative.   HENT: Negative.    Eyes: Negative.    Cardiovascular: Negative.    Respiratory: Negative.    Endocrine: Negative.    Hematologic/Lymphatic: Negative.    Skin: Negative.    Musculoskeletal: Negative.    Gastrointestinal: Negative.    Genitourinary: Negative.    Neurological: Negative.    Psychiatric/Behavioral: Negative.    Allergic/Immunologic: Negative.        Physical Exam  Vital signs were reviewed.   General Appearance: appears well nourished, appears relaxed, in no acute distress,wearing a face mask  Skin: warm, moist, intact, no rash or lesions, no xanthomas  HEENT: unremarkable, pupils equal and round, no scleral icterus, conjunctivae and lids normal  Lips & Mouth: no pallor or cyanosis  Neck Veins:  JVP is not elevated above the sternal notch,neck veins are flat, neck veins are not distended   Carotid Arteries: normal carotid upstroke bilaterally, no bruits bilaterally  Chest Inspection: chest is normal in appearance  Auscultation/Percussion/Effort: lungs clear to auscultation, no rales, rhonchi, or wheezing, respirations even and unlabored, no respiratory distress  Cardiac Rhythm: regular rhythm and normal rate   Cardiac Auscultation: normal S1 & S2, no S3 or S4, no rub   Murmurs:grade 1/6 systolic ejection murmur   Extremities: trace RLE and 1+ LLE pretibial edema,  2+ symmetric distal pulses , no blistering, weeping or evidence of cellulitis, pea-sized scabbed skin wound right shin which is healing, dime size left shin wound open without scabbing and with hyperemia at edges  Abdominal Exam: soft, non-tender,non-distended, no obvious masses, bowel sounds normal, no guarding, no abdominal bruits  Liver & Spleen: no organomegaly   Neurologic Exam: grossly intact, alert, moves all extremities equally   Orientation: oriented to time, person and place, clear historian  Gait: normal, steady, walks without assistance  Language & Memory: speech clear, patient responsive, seems to comprehend information  Psych: appropriate mood and affect, thought content and behavior normal             Cardiovascular Health Factors  Vitals BP Readings from Last 3 Encounters:   08/22/21 126/50   08/18/21 125/61   05/18/21 136/68     Wt Readings from Last 3 Encounters:   08/22/21 113.1 kg (249 lb 6.4 oz)   08/18/21 111.9 kg (246 lb 9.6 oz)   05/18/21 125.6 kg (276 lb 12.8 oz)     BMI Readings from Last 3 Encounters:   08/22/21 32.02 kg/m?   08/18/21 31.66 kg/m?   05/18/21 36.52 kg/m?      Smoking Social History     Tobacco Use   Smoking Status Never   Smokeless Tobacco Never      Lipid Profile Cholesterol   Date Value Ref Range Status   08/14/2021 92 <200 MG/DL Final     HDL   Date Value Ref Range Status   08/14/2021 34 (L) >40 MG/DL Final     LDL   Date Value Ref Range Status   08/14/2021 45 <100 mg/dL Final     Triglycerides   Date Value Ref Range Status   08/14/2021 61 <150 MG/DL Final      Blood Sugar Hemoglobin A1C   Date Value Ref Range Status   08/14/2021 7.9 (H) 4.0 - 5.7 % Final     Comment:     The ADA recommends that most patients with type 1 and type 2 diabetes maintain   an A1c level <7%.       Glucose   Date Value Ref Range Status   08/18/2021 126 (H) 70 - 100 MG/DL Final   16/03/9603 540 (H) 70 - 100 MG/DL Final   98/04/9146 829 (H) 70 - 100 MG/DL Final   56/21/3086 578 (H) 70 - 110 MG/DL Final   46/96/2952 95 70 - 110 MG/DL Final   84/13/2440 102 (H) 70 - 110 MG/DL Final     Glucose, POC   Date Value Ref Range Status   08/18/2021 193 (H) 70 - 100 MG/DL Final   72/53/6644 034 (H) 70 - 100 MG/DL Final   74/25/9563 875 (H) 70 - 100 MG/DL Final          Problems Addressed Today  Encounter Diagnoses   Name Primary?   ? Chronic diastolic heart failure (HCC) Yes   ? Nonrheumatic aortic valve stenosis    ? Hypertension, essential    ? Pulmonary HTN (HCC)    ? S/P CABG (coronary artery bypass graft)    ? S/p TAVR (transcatheter aortic valve replacement), bioprosthetic    ? Stage 3a chronic kidney disease (HCC)    ? Non-healing non-surgical wound                      Current Medications (including today's revisions)  ? acetaminophen (TYLENOL) 325 mg tablet Take two tablets by mouth every 6 hours as needed.   ? aspirin EC (ASPIR-LOW) 81 mg tablet Take one tablet by mouth daily. Take with food.   ? furosemide (LASIX) 40 mg tablet Take one tablet by mouth twice daily.   ? losartan (COZAAR) 25 mg tablet Take one tablet by mouth daily.   ? metOLazone (ZAROXOLYN) 2.5 mg tablet Take one tablet by mouth three times weekly every Monday, Wednesday, and Friday.   ? metoprolol succinate XL (TOPROL XL) 25 mg extended release tablet Take one tablet by mouth daily.   ?  mupirocin (BACTROBAN) 2 % topical ointment Apply  topically to affected area as Needed.   ? nitroglycerin (NITROSTAT) 0.4 mg tablet Place 1 Tab under tongue every 5 minutes as needed for Chest Pain.   ? pioglitazone (ACTOS) 30 mg tablet Take one tablet by mouth daily.   ? senna/docusate (SENOKOT-S) 8.6/50 mg tablet Take two tablets by mouth daily. Indications: constipation   ? simvastatin (ZOCOR) 20 mg tablet TAKE ONE TABLET BY MOUTH AT BEDTIME (Patient taking differently: Take one tablet by mouth at bedtime daily.)   ? spironolactone (ALDACTONE) 25 mg tablet Take one-half tablet by mouth twice daily. Take with food.

## 2021-08-22 NOTE — Telephone Encounter
08/22/2021 3:00 PM  Spoke to Reola Calkins at Dr. Gilles Chiquito office, [phone].  She will speak to Dr. Andreas Newport and set patient up with [facility] at [facility].

## 2021-08-23 ENCOUNTER — Encounter: Admit: 2021-08-23 | Discharge: 2021-08-23 | Payer: MEDICARE

## 2021-08-24 ENCOUNTER — Encounter: Admit: 2021-08-24 | Discharge: 2021-08-24 | Payer: MEDICARE

## 2021-08-24 DIAGNOSIS — I350 Nonrheumatic aortic (valve) stenosis: Secondary | ICD-10-CM

## 2021-08-24 DIAGNOSIS — I5032 Chronic diastolic (congestive) heart failure: Secondary | ICD-10-CM

## 2021-08-24 DIAGNOSIS — I10 Essential (primary) hypertension: Secondary | ICD-10-CM

## 2021-08-24 MED ORDER — LOSARTAN 25 MG PO TAB
25 mg | ORAL_TABLET | Freq: Every day | ORAL | 3 refills | 90.00000 days | Status: AC
Start: 2021-08-24 — End: ?

## 2021-09-04 ENCOUNTER — Encounter: Admit: 2021-09-04 | Discharge: 2021-09-04 | Payer: MEDICARE

## 2021-09-04 DIAGNOSIS — I350 Nonrheumatic aortic (valve) stenosis: Secondary | ICD-10-CM

## 2021-09-04 DIAGNOSIS — I5032 Chronic diastolic (congestive) heart failure: Secondary | ICD-10-CM

## 2021-09-07 ENCOUNTER — Encounter: Admit: 2021-09-07 | Discharge: 2021-09-07 | Payer: MEDICARE

## 2021-09-07 DIAGNOSIS — E669 Obesity, unspecified: Secondary | ICD-10-CM

## 2021-09-07 DIAGNOSIS — E119 Type 2 diabetes mellitus without complications: Secondary | ICD-10-CM

## 2021-09-07 DIAGNOSIS — I10 Essential (primary) hypertension: Secondary | ICD-10-CM

## 2021-09-07 DIAGNOSIS — R569 Unspecified convulsions: Secondary | ICD-10-CM

## 2021-09-07 DIAGNOSIS — M1990 Unspecified osteoarthritis, unspecified site: Secondary | ICD-10-CM

## 2021-09-07 DIAGNOSIS — E785 Hyperlipidemia, unspecified: Secondary | ICD-10-CM

## 2021-09-07 DIAGNOSIS — I2510 Atherosclerotic heart disease of native coronary artery without angina pectoris: Secondary | ICD-10-CM

## 2021-09-07 DIAGNOSIS — I5032 Chronic diastolic (congestive) heart failure: Secondary | ICD-10-CM

## 2021-09-07 DIAGNOSIS — I4891 Unspecified atrial fibrillation: Secondary | ICD-10-CM

## 2021-09-07 MED ORDER — SPIRONOLACTONE 25 MG PO TAB
12.5 mg | ORAL_TABLET | Freq: Two times a day (BID) | ORAL | 3 refills | 90.00000 days | Status: AC
Start: 2021-09-07 — End: ?

## 2021-09-07 NOTE — Progress Notes
Date of Service: 09/07/2021    Fernando [name] is a 87 y.o. male.       HPI    Fernando [name]  has been followed for severe aortic valve stenosis, permanent atrial fibrillation and heart failure with preserved ejection fraction.  He was hospitalized from March 5 until August 18, 2021 for heart failure and also had COVID at the time.  He was diuresed 25 pounds.  He has done well since discharge though his weight may be up approximately 3 pounds.  He is trying to watch his salt intake a lot better.  ?He plans on continuing attending cardiac rehab as long as he can.  Currently he goes 2 days a week where he walks on the treadmill for 20 minutes and uses the NuStep machine for 20 minutes.  He has 1 healing ulcer on his left pretibial surface that is improving with topical therapy.  He sees wound care in [place] on a regular basis. Overall,?Fernando [name]?reports consistent gradual improvement in his chronic venous stasis ulcers on both pretibial surfaces. ?He continues topical treatment with medicated patches.?Otherwise,?the patient?reports that he continues to feel well and has no complaints.??The patient reports no angina, palpitations, sensation of sustained forceful heart pounding, lightheadedness or syncope. The patient reports no myalgias, claudication, bleeding abnormalities,?or new neurologic abnormalities. ?His CHA2DS2-VASc score is?5?for age,?diabetes, peripheral vascular disease?and hypertension.??He has had prior surgical ligation of his left atrial appendage.   Historically, ?Fernando [name]?has been followed in the past for coronary artery disease,?having undergone coronary bypass surgery in 2007.??He has also been followed for permanent atrial fibrillation. ?He fell in December 2021. ?He?then?developed abnormalities in?balance and evaluation revealed a subdural hematoma. ?He did have a seizure. ?He was taken off his aspirin and Eliquis and surgical intervention was not required.?He has had prior surgical ligation of his left atrial appendage. on May 24, 2016?during a?routine visit,?a routine ECG revealed atrial fibrillation.?At that time he reported no palpitations or sensation of sustained forceful heart pounding.???He did?not want to consider cardioversion, antiarrhythmic therapy or arrhythmia ablation and excepted permanent atrial fibrillation.[name]?He deferred anticoagulation until November 2021 when he started apixaban.  Fernando [name] also fell while cleaning his driveway drain on Nov 02 2020. ?This required 3 staples in the back of his head.??Fernando [name] had?severe aortic valve stenosis and underwent transcatheter aortic valve replacement on 01/12/2021 receiving a?26 mm S3 valve.?       Vitals:    09/07/21 0915   BP: 138/72   BP Source: Arm, Left Upper   Pulse: 64   SpO2: 99%   O2 Device: None (Room air)   PainSc: Zero   Weight: 117.4 kg (258 lb 12.8 oz)   Height: 188 cm (6' 2)     Body mass index is 33.23 kg/m?[name]     Past Medical History  Patient Active Problem List    Diagnosis Date Noted   ? Heart failure (HCC) 08/13/2021   ? S/p TAVR (transcatheter aortic valve replacement), bioprosthetic 01/12/2021     01/12/21 Dr. Helen Hashimoto and Dr. Riley Nearing     ? Hypokalemia 01/12/2021   ? Hypomagnesemia 01/12/2021   ? Nonrheumatic aortic valve stenosis 12/29/2020   ? Acute on chronic combined systolic and diastolic congestive heart failure, NYHA class 2 (HCC) 12/29/2020   ? Stage 3a chronic kidney disease (HCC) 12/29/2020   ? Venous stasis of lower extremity 12/29/2020   ? Pulmonary HTN (HCC) 11/24/2020   ? Chronic diastolic heart failure (HCC) 11/24/2020   ? Subdural hematoma 08/15/2020   ?  Permanent atrial fibrillation (HCC) 06/29/2020   ? Osteoarthrosis, unspecified whether generalized or localized, lower leg 03/11/2012   ? Primary localized osteoarthrosis, lower leg 12/18/2011   ? Pain in joint, lower leg 12/18/2011   ? S/P knee replacement 12/18/2011   ? Knee pain 10/25/2011   ? Osteoarthritis of knee 10/25/2011   ? 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 CAD (coronary artery disease) 11/11/2008     status post CABG on 06/18/2005 (patient received a LIMA to      LAD, reverse SVG to OM branch, reverse SVG to mid-RCA and reverse SVG to second      diagonal).  Patient is angina free and he is a functional Class I.  History of chest pain and admission in [facility] in 11/2007.  Exercise myocardial perfusion imaging study dated 11/13/2007, unremarkable for      ischemia.     ? Obesity 11/11/2008   ? Hyperlipidemia 11/11/2008   ? Hypertension, essential 11/11/2008         Review of Systems   Constitutional: Negative.   HENT: Negative.    Eyes: Negative.    Cardiovascular: Negative.    Respiratory: Negative.    Endocrine: Negative.    Hematologic/Lymphatic: Negative.    Skin: Negative.    Musculoskeletal: Negative.    Gastrointestinal: Negative.    Genitourinary: Negative.    Neurological: Negative.    Psychiatric/Behavioral: Negative.    Allergic/Immunologic: Negative.        Physical Exam  GENERAL: The patient is well developed, well nourished, resting comfortably and in no distress.   HEENT: No abnormalities of the visible oro-nasopharynx, conjunctiva or sclera are noted.  NECK: There is no jugular venous distension. Carotids are palpable and without bruits. There is no thyroid enlargement.  Chest: Lung fields are clear to auscultation. There are no wheezes or crackles.  CV: There is a regular rhythm. The first and second heart sounds are normal.??A soft systolic ejection murmur is heard. ?There are no?diastolic?murmurs, gallops or rubs.  ABD: The abdomen is soft and supple with normal bowel sounds. There is no hepatosplenomegaly, ascites, tenderness, masses or bruits.  Neuro: There are no focal motor defects. Ambulation is normal. Cognitive function appears normal.  Ext:?There is 2+ bilateral lower extremity?edema ?evidence of deep vein thrombosis. Peripheral pulses are satisfactory. ?  SKIN:?There are no rashes and no cellulitis. ?There is a  small healing wound on the medial surface of his left lower foreleg.  PSYCH:?The patient is calm, rationale and oriented.    Cardiovascular Studies  A twelve-lead ECG obtained on August 13, 2021 reveals atrial fibrillation with a heart rate of 57 bpm.  Right bundle branch block is noted along with nondiagnostic ST-T wave abnormalities.  Labs from August 24, 2021 revealed serum potassium 4.4 mmol/L and serum creatinine 1.90 mg/dL.    Echo Doppler study 08/15/2021:  Interpretation Summary    ? Normal left ventricular size.  There is mildly increased left ventricular wall thickness. The left ventricular systolic function is normal with an estimated ejection fraction of 60 to 65%.  Abnormal septal motion consistent with RV pressure and volume overload.  Unable to assess severity of diastolic dysfunction, but elevated E/e' suggestive of increased left atrial pressure.  ? Right ventricle is severely dilated with mild to moderate systolic dysfunction.  ? The left atrium is mildly dilated.  The right atrium is severely dilated.  ? There is a 26 mm S3 bioprosthetic valve in the aortic position.  Limited views of the aortic valve leaflets suggest that they are moving appropriately.  No evidence of valvular stenosis or regurgitation.  ? Severe tricuspid regurgitation with evidence of hepatic vein systolic flow reversal likely secondary to a dilated annulus.  ? Moderate pulmonic valve regurgitation.  ? Severely increased RA pressure estimated to be 15 mmHg.  Severely elevated pulmonary arterial systolic pressure that is estimated to be 76 mmHg.  Estimated pulmonary arterial diastolic pressure is 22 mmHg.  ? No pericardial effusion.  ?  When compared to prior study from September 2022, there have been no significant interval changes.  The right ventricular size and function appears similar.  There continues to be severe tricuspid regurgitation.  Pulmonary pressures continue to be severely elevated.      Cardiovascular Health Factors  Vitals BP Readings from Last 3 Encounters:   09/07/21 138/72   08/22/21 126/50   08/18/21 125/61     Wt Readings from Last 3 Encounters:   09/07/21 117.4 kg (258 lb 12.8 oz)   08/22/21 113.1 kg (249 lb 6.4 oz)   08/18/21 111.9 kg (246 lb 9.6 oz)     BMI Readings from Last 3 Encounters:   09/07/21 33.23 kg/m?   08/22/21 32.02 kg/m?   08/18/21 31.66 kg/m?      Smoking Social History     Tobacco Use   Smoking Status Never   Smokeless Tobacco Never      Lipid Profile Cholesterol   Date Value Ref Range Status   08/14/2021 92 <200 MG/DL Final     HDL   Date Value Ref Range Status   08/14/2021 34 (L) >40 MG/DL Final     LDL   Date Value Ref Range Status   08/14/2021 45 <100 mg/dL Final     Triglycerides   Date Value Ref Range Status   08/14/2021 61 <150 MG/DL Final      Blood Sugar Hemoglobin A1C   Date Value Ref Range Status   08/14/2021 7.9 (H) 4.0 - 5.7 % Final     Comment:     The ADA recommends that most patients with type 1 and type 2 diabetes maintain   an A1c level <7%.       Glucose   Date Value Ref Range Status   08/24/2021 147 (H) 70 - 105 Final   08/18/2021 126 (H) 70 - 100 MG/DL Final   45/40/9811 914 (H) 70 - 100 MG/DL Final   78/29/5621 308 (H) 70 - 110 MG/DL Final   65/78/4696 95 70 - 110 MG/DL Final   29/52/8413 244 (H) 70 - 110 MG/DL Final     Glucose, POC   Date Value Ref Range Status   08/18/2021 193 (H) 70 - 100 MG/DL Final   06/13/7251 664 (H) 70 - 100 MG/DL Final   40/34/7425 956 (H) 70 - 100 MG/DL Final          Problems Addressed Today  Encounter Diagnoses   Name Primary?   ? Hypertension, essential    ? Coronary artery disease involving native coronary artery of native heart without angina pectoris    ? Chronic diastolic heart failure (HCC)        Assessment and Plan    Mr. Pezzano? is generally doing well following transcatheter aortic valve replacement.  He is fairly well compensated with respect to heart failure on examination today.  However, he has gained several pounds since discharge on August 18, 2021.  He went to the double his  furosemide for the next 3 days.  I have also asked him to talk to his primary care physician about alternative medications other than pioglitazone for treatment of his diabetes.  Pioglitazone can lead to fluid retention in patients with heart failure.  Once he completes 3-day course of increased diuretic, I reemphasized the importance of continuing his current diuretic regimen, furosemide 40 mg twice daily and metolazone 2.5 mg 3 days a week to control his edema. ?In addition his chronic venous stasis ulcer on his left lower foreleg appears to be healing with continued local therapy. ?I would strongly recommend that Mr. Mcquerry?continue cardiac rehabilitation and believe that it is medically necessary. ?It has had a strong beneficial effect for the patient. ?He was given a requisition to check his Chem-7 considering his current diuretic regimen. ?I have asked him to return for follow-up in 3 months time. The total time spent during this interview and exam was?30?minutes.           Current Medications (including today's revisions)  ? acetaminophen (TYLENOL) 325 mg tablet Take two tablets by mouth every 6 hours as needed.   ? aspirin EC (ASPIR-LOW) 81 mg tablet Take one tablet by mouth daily. Take with food.   ? furosemide (LASIX) 40 mg tablet Take one tablet by mouth twice daily.   ? losartan (COZAAR) 25 mg tablet Take one tablet by mouth daily.   ? metOLazone (ZAROXOLYN) 2.5 mg tablet Take one tablet by mouth three times weekly every Monday, Wednesday, and Friday.   ? metoprolol succinate XL (TOPROL XL) 25 mg extended release tablet Take one tablet by mouth daily.   ? mupirocin (BACTROBAN) 2 % topical ointment Apply  topically to affected area as Needed.   ? nitroglycerin (NITROSTAT) 0.4 mg tablet Place 1 Tab under tongue every 5 minutes as needed for Chest Pain.   ? pioglitazone (ACTOS) 30 mg tablet Take one tablet by mouth daily.   ? senna/docusate (SENOKOT-S) 8.6/50 mg tablet Take two tablets by mouth daily. Indications: constipation   ? simvastatin (ZOCOR) 20 mg tablet TAKE ONE TABLET BY MOUTH AT BEDTIME (Patient taking differently: Take one tablet by mouth at bedtime daily.)   ? spironolactone (ALDACTONE) 25 mg tablet Take one-half tablet by mouth twice daily. Take with food.

## 2021-09-22 ENCOUNTER — Encounter: Admit: 2021-09-22 | Discharge: 2021-09-22 | Payer: MEDICARE

## 2021-10-18 ENCOUNTER — Encounter: Admit: 2021-10-18 | Discharge: 2021-10-18 | Payer: MEDICARE

## 2021-10-23 ENCOUNTER — Encounter: Admit: 2021-10-23 | Discharge: 2021-10-23 | Payer: MEDICARE

## 2021-10-23 DIAGNOSIS — Z953 Presence of xenogenic heart valve: Secondary | ICD-10-CM

## 2021-10-23 DIAGNOSIS — I10 Essential (primary) hypertension: Secondary | ICD-10-CM

## 2021-11-17 ENCOUNTER — Encounter: Admit: 2021-11-17 | Discharge: 2021-11-17 | Payer: MEDICARE

## 2021-11-17 DIAGNOSIS — I2510 Atherosclerotic heart disease of native coronary artery without angina pectoris: Secondary | ICD-10-CM

## 2021-11-17 DIAGNOSIS — I5032 Chronic diastolic (congestive) heart failure: Secondary | ICD-10-CM

## 2021-11-17 DIAGNOSIS — I10 Essential (primary) hypertension: Secondary | ICD-10-CM

## 2021-11-21 ENCOUNTER — Encounter: Admit: 2021-11-21 | Discharge: 2021-11-21 | Payer: MEDICARE

## 2021-11-21 DIAGNOSIS — M1990 Unspecified osteoarthritis, unspecified site: Secondary | ICD-10-CM

## 2021-11-21 DIAGNOSIS — I2510 Atherosclerotic heart disease of native coronary artery without angina pectoris: Secondary | ICD-10-CM

## 2021-11-21 DIAGNOSIS — E669 Obesity, unspecified: Secondary | ICD-10-CM

## 2021-11-21 DIAGNOSIS — I4891 Unspecified atrial fibrillation: Secondary | ICD-10-CM

## 2021-11-21 DIAGNOSIS — E785 Hyperlipidemia, unspecified: Secondary | ICD-10-CM

## 2021-11-21 DIAGNOSIS — R569 Unspecified convulsions: Secondary | ICD-10-CM

## 2021-11-21 DIAGNOSIS — I5032 Chronic diastolic (congestive) heart failure: Secondary | ICD-10-CM

## 2021-11-21 DIAGNOSIS — I10 Essential (primary) hypertension: Secondary | ICD-10-CM

## 2021-11-21 DIAGNOSIS — E119 Type 2 diabetes mellitus without complications: Secondary | ICD-10-CM

## 2021-11-21 NOTE — Progress Notes
Date of Service: 11/21/2021    Fernando [name] is a 87 y.o. male.       HPI    Fernando [name]? has been followed for severe aortic valve stenosis, permanent atrial fibrillation and heart failure with preserved ejection fraction.  He was hospitalized from March 5 until August 18, 2021 for heart failure and also had COVID at the time.  He was diuresed 25 pounds.  He has done well since discharge without recurrent hospitalizations or emergency room visits.  He has done extremely well on his current diuretic regimen. He is trying to watch his salt intake a lot better.?He?is still enrolled in cardiac rehab and plans on continuing?attending cardiac rehab as long as he can.  Currently he goes 2?days a week where he walks on the treadmill for 20 minutes and uses the NuStep machine for 20 minutes. ? He sees wound care in [place] on a regular basis for recurrent pretibial ulcers.  Currently they are very well controlled.[name]?Overall,?Fernando [name]?reports consistent gradual improvement in his chronic venous stasis?ulcers on both pretibial surfaces.??He continues?topical treatment?with medicated patches.?Otherwise,?the patient?reports that he continues to feel well and has no complaints.??The patient reports no angina, palpitations, sensation of sustained forceful heart pounding, lightheadedness or syncope. The patient reports no myalgias, claudication,?bleeding abnormalities,?or new neurologic abnormalities. ?His CHA2DS2-VASc score is?5?for age,?diabetes, peripheral vascular disease?and hypertension.??He has had prior surgical ligation of his left atrial appendage.   Historically, ?Fernando [name]?has been followed in the past for coronary artery disease,?having undergone coronary bypass surgery in 2007.??He has also been followed for permanent atrial fibrillation. ?He fell in December 2021. ?He?then?developed abnormalities in?balance and evaluation revealed a subdural hematoma. ?He did have a seizure. ?He was taken off his aspirin and Eliquis and surgical intervention was not required.?He has had prior surgical ligation of his left atrial appendage. on May 24, 2016?during a?routine visit,?a routine ECG revealed atrial fibrillation.?At that time he reported no palpitations or sensation of sustained forceful heart pounding.???He did?not want to consider cardioversion, antiarrhythmic therapy or arrhythmia ablation and excepted permanent atrial fibrillation.[name]?He deferred anticoagulation until November 2021 when he started apixaban. ?Fernando [name]?also fell while cleaning his driveway drain on Nov 02 2020. ?This required 3 staples in the back of his head.??Fernando [name] had?severe aortic valve stenosis?and?underwent transcatheter aortic valve replacement on 01/12/2021 receiving a?26 mm S3 valve       Vitals:    11/21/21 1128   BP: 136/86   BP Source: Arm, Left Upper   Pulse: 62   SpO2: 100%   O2 Percent: 100 %   O2 Device: None (Room air)   PainSc: Zero   Weight: 110.7 kg (244 lb)   Height: 188 cm (6' 2)     Body mass index is 31.33 kg/m?[name]     Past Medical History  Patient Active Problem List    Diagnosis Date Noted   ? Heart failure (HCC) 08/13/2021   ? S/p TAVR (transcatheter aortic valve replacement), bioprosthetic 01/12/2021     01/12/21 Dr. Helen Hashimoto and Dr. Riley Nearing     ? Hypokalemia 01/12/2021   ? Hypomagnesemia 01/12/2021   ? Nonrheumatic aortic valve stenosis 12/29/2020   ? Acute on chronic combined systolic and diastolic congestive heart failure, NYHA class 2 (HCC) 12/29/2020   ? Stage 3a chronic kidney disease (HCC) 12/29/2020   ? Venous stasis of lower extremity 12/29/2020   ? Pulmonary HTN (HCC) 11/24/2020   ? Chronic diastolic heart failure (HCC) 11/24/2020   ? Subdural hematoma (HCC) 08/15/2020   ? Permanent  atrial fibrillation (HCC) 06/29/2020   ? Osteoarthrosis, unspecified whether generalized or localized, lower leg 03/11/2012   ? Primary localized osteoarthrosis, lower leg 12/18/2011   ? Pain in joint, lower leg 12/18/2011   ? S/P knee replacement 12/18/2011   ? Knee pain 10/25/2011   ? Osteoarthritis of knee 10/25/2011   ? 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 CAD (coronary artery disease) 11/11/2008     status post CABG on 06/18/2005 (patient received a LIMA to      LAD, reverse SVG to OM branch, reverse SVG to mid-RCA and reverse SVG to second      diagonal).  Patient is angina free and he is a functional Class I.  History of chest pain and admission in [facility] in 11/2007.  Exercise myocardial perfusion imaging study dated 11/13/2007, unremarkable for      ischemia.     ? Obesity 11/11/2008   ? Hyperlipidemia 11/11/2008   ? Hypertension, essential 11/11/2008         Review of Systems   Constitutional: Negative.   HENT: Negative.    Eyes: Negative.    Cardiovascular: Positive for leg swelling.   Respiratory: Negative.    Endocrine: Negative.    Hematologic/Lymphatic: Negative.    Skin: Negative.    Musculoskeletal: Negative.    Gastrointestinal: Negative.    Genitourinary: Negative.    Neurological: Negative.    Psychiatric/Behavioral: Negative.    Allergic/Immunologic: Negative.        Physical Exam  GENERAL: The patient is well developed, well nourished, resting comfortably and in no distress.   HEENT: No abnormalities of the visible oro-nasopharynx, conjunctiva or sclera are noted.  NECK: There is no jugular venous distension. Carotids are palpable and without bruits. There is no thyroid enlargement.  Chest: Lung fields are clear to auscultation. There are no wheezes or crackles.  CV: There is a regular rhythm. The first and second heart sounds are normal.??A soft systolic ejection murmur is heard. ?There are no?diastolic?murmurs, gallops or rubs.  ABD: The abdomen is soft and supple with normal bowel sounds. There is no hepatosplenomegaly, ascites, tenderness, masses or bruits.  Neuro: There are no focal motor defects. Ambulation is normal. Cognitive function appears normal.  Ext:?There is 1+ bilateral lower extremity?edema ?evidence of deep vein thrombosis. Peripheral pulses are satisfactory. ?  SKIN:?There are no rashes and no cellulitis. ?There is a? small?healing wound on the medial surface of his left lower foreleg.  PSYCH:?The patient is calm, rationale and oriented.  ?  Cardiovascular Studies  A twelve-lead ECG obtained on August 13, 2021 reveals atrial fibrillation with a heart rate of 57 bpm.  Right bundle branch block is noted along with nondiagnostic ST-T wave abnormalities.  Labs from August 24, 2021 revealed serum potassium 4.4 mmol/L and serum creatinine 1.90 mg/dL.  ?  Echo Doppler study 08/15/2021:  Interpretation Summary  ?  ? Normal left ventricular size. ?There is mildly increased left ventricular wall thickness. The left ventricular systolic function is normal with an estimated ejection fraction of 60 to 65%. ?Abnormal septal motion consistent with RV pressure and volume overload. ?Unable to assess severity of diastolic dysfunction, but elevated E/e' suggestive of increased left atrial pressure.  ? Right ventricle is severely dilated with mild to moderate systolic dysfunction.  ? The left atrium is mildly dilated. ?The right atrium is severely dilated.  ? There is a 26 mm S3 bioprosthetic valve in the aortic position. ?Limited views of the aortic valve  leaflets suggest that they are moving appropriately. ?No evidence of valvular stenosis or regurgitation.  ? Severe tricuspid regurgitation with evidence of hepatic vein systolic flow reversal likely secondary to a dilated annulus.  ? Moderate pulmonic valve regurgitation.  ? Severely increased RA pressure estimated to be 15 mmHg. ?Severely elevated pulmonary arterial systolic pressure that is estimated to be 76 mmHg. ?Estimated pulmonary arterial diastolic pressure is 22 mmHg.  ? No pericardial effusion.  ?  When compared to prior study from September 2022, there have been no significant interval changes. ?The right ventricular size and function appears similar. ?There continues to be severe tricuspid regurgitation. ?Pulmonary pressures continue to be severely elevated.    Cardiovascular Health Factors  Vitals BP Readings from Last 3 Encounters:   11/21/21 136/86   09/07/21 138/72   08/22/21 126/50     Wt Readings from Last 3 Encounters:   11/21/21 110.7 kg (244 lb)   09/07/21 117.4 kg (258 lb 12.8 oz)   08/22/21 113.1 kg (249 lb 6.4 oz)     BMI Readings from Last 3 Encounters:   11/21/21 31.33 kg/m?   09/07/21 33.23 kg/m?   08/22/21 32.02 kg/m?      Smoking Social History     Tobacco Use   Smoking Status Never   Smokeless Tobacco Never   Vaping Use   ? Vaping Use: Never used      Lipid Profile Cholesterol   Date Value Ref Range Status   08/14/2021 92 <200 MG/DL Final     HDL   Date Value Ref Range Status   08/14/2021 34 (L) >40 MG/DL Final     LDL   Date Value Ref Range Status   08/14/2021 45 <100 mg/dL Final     Triglycerides   Date Value Ref Range Status   08/14/2021 61 <150 MG/DL Final      Blood Sugar Hemoglobin A1C   Date Value Ref Range Status   08/14/2021 7.9 (H) 4.0 - 5.7 % Final     Comment:     The ADA recommends that most patients with type 1 and type 2 diabetes maintain   an A1c level <7%.       Glucose   Date Value Ref Range Status   11/16/2021 130 (H) 70 - 105    08/24/2021 147 (H) 70 - 105 Final   08/18/2021 126 (H) 70 - 100 MG/DL Final   16/03/9603 540 (H) 70 - 110 MG/DL Final   98/04/9146 95 70 - 110 MG/DL Final   82/95/6213 086 (H) 70 - 110 MG/DL Final     Glucose, POC   Date Value Ref Range Status   08/18/2021 193 (H) 70 - 100 MG/DL Final   57/84/6962 952 (H) 70 - 100 MG/DL Final   84/13/2440 102 (H) 70 - 100 MG/DL Final          Problems Addressed Today  Encounter Diagnoses   Name Primary?   ? Hypertension, essential    ? Coronary artery disease involving native coronary artery of native heart without angina pectoris    ? Chronic diastolic heart failure (HCC)        Assessment and Plan     Mr. Rahal??is generally?doing well following transcatheter aortic valve replacement.    His heart failure appears extremely well compensated on examination today and he has been very active and mobile with very little of any limitations.  His weight is actually down 14 pounds compared to his last clinic visit  in March 2023. ?In addition his chronic venous stasis ulcer on his left lower foreleg appears to be healing with continued local therapy. ?I would strongly recommend that Mr. Knerr?continue cardiac rehabilitation and believe that it is medically necessary. ?It has had a strong beneficial effect for the patient. ?I have asked him to return for follow-up in?6?months time. The total time spent during this interview and exam was?30?minutes.         Current Medications (including today's revisions)  ? acetaminophen (TYLENOL) 325 mg tablet Take two tablets by mouth every 6 hours as needed.   ? aspirin EC (ASPIR-LOW) 81 mg tablet Take one tablet by mouth daily. Take with food.   ? fluticasone propionate (FLONASE) 50 mcg/actuation nasal spray, suspension Apply two sprays to each nostril as directed as Needed.   ? furosemide (LASIX) 40 mg tablet Take one tablet by mouth twice daily.   ? losartan (COZAAR) 25 mg tablet Take one tablet by mouth daily.   ? metOLazone (ZAROXOLYN) 2.5 mg tablet Take one tablet by mouth three times weekly every Monday, Wednesday, and Friday.   ? metoprolol succinate XL (TOPROL XL) 25 mg extended release tablet Take one tablet by mouth daily.   ? nitroglycerin (NITROSTAT) 0.4 mg tablet Place 1 Tab under tongue every 5 minutes as needed for Chest Pain.   ? polyethylene glycol 3350 (MIRALAX) 17 g packet Take one packet by mouth as Needed.   ? simvastatin (ZOCOR) 20 mg tablet TAKE ONE TABLET BY MOUTH AT BEDTIME (Patient taking differently: Take one tablet by mouth at bedtime daily.)   ? spironolactone (ALDACTONE) 25 mg tablet Take one-half tablet by mouth twice daily. Take with food.

## 2022-01-16 ENCOUNTER — Encounter: Admit: 2022-01-16 | Discharge: 2022-01-16 | Payer: MEDICARE

## 2022-02-06 ENCOUNTER — Encounter: Admit: 2022-02-06 | Discharge: 2022-02-06 | Payer: MEDICARE

## 2022-02-06 MED ORDER — SPIRONOLACTONE 25 MG PO TAB
12.5 mg | ORAL_TABLET | Freq: Two times a day (BID) | ORAL | 3 refills | 90.00000 days | Status: AC
Start: 2022-02-06 — End: ?

## 2022-02-14 ENCOUNTER — Encounter: Admit: 2022-02-14 | Discharge: 2022-02-14 | Payer: MEDICARE

## 2022-02-14 ENCOUNTER — Ambulatory Visit: Admit: 2022-02-14 | Discharge: 2022-02-14 | Payer: MEDICARE

## 2022-02-14 DIAGNOSIS — E669 Obesity, unspecified: Secondary | ICD-10-CM

## 2022-02-14 DIAGNOSIS — I10 Essential (primary) hypertension: Secondary | ICD-10-CM

## 2022-02-14 DIAGNOSIS — R0989 Other specified symptoms and signs involving the circulatory and respiratory systems: Secondary | ICD-10-CM

## 2022-02-14 DIAGNOSIS — Z951 Presence of aortocoronary bypass graft: Secondary | ICD-10-CM

## 2022-02-14 DIAGNOSIS — I4891 Unspecified atrial fibrillation: Secondary | ICD-10-CM

## 2022-02-14 DIAGNOSIS — I2510 Atherosclerotic heart disease of native coronary artery without angina pectoris: Secondary | ICD-10-CM

## 2022-02-14 DIAGNOSIS — E785 Hyperlipidemia, unspecified: Secondary | ICD-10-CM

## 2022-02-14 DIAGNOSIS — E119 Type 2 diabetes mellitus without complications: Secondary | ICD-10-CM

## 2022-02-14 DIAGNOSIS — Z953 Presence of xenogenic heart valve: Secondary | ICD-10-CM

## 2022-02-14 DIAGNOSIS — I361 Nonrheumatic tricuspid (valve) insufficiency: Secondary | ICD-10-CM

## 2022-02-14 DIAGNOSIS — I4821 Permanent atrial fibrillation: Secondary | ICD-10-CM

## 2022-02-14 DIAGNOSIS — M1990 Unspecified osteoarthritis, unspecified site: Secondary | ICD-10-CM

## 2022-02-14 DIAGNOSIS — I350 Nonrheumatic aortic (valve) stenosis: Secondary | ICD-10-CM

## 2022-02-14 DIAGNOSIS — I5032 Chronic diastolic (congestive) heart failure: Secondary | ICD-10-CM

## 2022-02-14 DIAGNOSIS — R569 Unspecified convulsions: Secondary | ICD-10-CM

## 2022-02-14 LAB — BASIC METABOLIC PANEL
ANION GAP: 11 (ref 3–12)
CO2: 26 MMOL/L (ref 21–30)
POTASSIUM: 4.5 MMOL/L (ref 3.5–5.1)
SODIUM: 138 MMOL/L (ref 137–147)

## 2022-02-14 LAB — CBC
HEMATOCRIT: 40 % (ref 40–50)
HEMOGLOBIN: 13 g/dL — ABNORMAL HIGH (ref 13.5–16.5)
MCH: 33 pg (ref 26–34)
MCHC: 34 g/dL (ref 32.0–36.0)
MCV: 98 FL — ABNORMAL LOW (ref >60)
MPV: 8.9 FL (ref 7–11)
PLATELET COUNT: 185 K/UL (ref 150–400)
RBC COUNT: 4 M/UL — ABNORMAL LOW (ref 4.4–5.5)
RDW: 13 % (ref 11–15)
WBC COUNT: 8.8 K/UL — ABNORMAL HIGH (ref 4.5–11.0)

## 2022-02-14 LAB — BNP (B-TYPE NATRIURETIC PEPTI): BNP: 205 pg/mL — ABNORMAL HIGH (ref 0–100)

## 2022-02-14 MED ORDER — PERFLUTREN LIPID MICROSPHERES 1.1 MG/ML IV SUSP
1-10 mL | Freq: Once | INTRAVENOUS | 0 refills | Status: CP | PRN
Start: 2022-02-14 — End: ?
  Administered 2022-02-14: 16:00:00 1.5 mL via INTRAVENOUS

## 2022-02-14 NOTE — Progress Notes
Date of Service: 02/14/2022    Fernando [name] is a 87 y.o. male.       HPI       I had the pleasure of seeing Fernando [name] for 1 year post TAVR follow-up.  He is an 87 year old with history of coronary disease with prior CABG, permanent A-fib, diabetes, hypertension, hyperlipidemia and aortic stenosis.  He developed progressive aortic stenosis and was referred for TAVR.  Cardiac cath prior to procedure showed patent grafts.  He underwent TAVR with a 26 SAPIEN valve in August 2022.  Postprocedure echoes have been stable with a well-seated valve with no stenosis or regurgitation.  He also has some mitral regurgitation and tricuspid regurgitation.    Unfortunately he was admitted in March for heart failure exacerbation.  He diuresed 30 pounds during his hospital stay.  He was also incidentally noted to be positive for COVID.  He has been doing well since then.  Although he states he does not notice a difference post TAVR his son states he is significantly less short of breath.  He still participates in cardiac rehab twice a week and plans to continue indefinitely.  In general he is very active with no limitations.  He denies any shortness of breath or orthopnea.  He states he has lower extremity edema that builds up by the end of the day but is fine in the morning.  He continues torsemide and spironolactone.  He denies chest pain, palpitations, near-syncope or syncope.  His systolic blood pressure readings range from 120-140.           Vitals:    02/14/22 1055   BP: (!) 150/80   BP Source: Arm, Right Upper   Pulse: 66   PainSc: Zero   Weight: 107 kg (236 lb)   Height: 188 cm (6' 2)     Body mass index is 30.3 kg/m?[name]     Past Medical History  Patient Active Problem List    Diagnosis Date Noted   ? Heart failure (HCC) 08/13/2021   ? S/p TAVR (transcatheter aortic valve replacement), bioprosthetic 01/12/2021     01/12/21 Dr. Helen Hashimoto and Dr. Riley Nearing     ? Hypokalemia 01/12/2021   ? Hypomagnesemia 01/12/2021   ? Nonrheumatic aortic valve stenosis 12/29/2020   ? Acute on chronic combined systolic and diastolic congestive heart failure, NYHA class 2 (HCC) 12/29/2020   ? Stage 3a chronic kidney disease (HCC) 12/29/2020   ? Venous stasis of lower extremity 12/29/2020   ? Pulmonary HTN (HCC) 11/24/2020   ? Chronic diastolic heart failure (HCC) 11/24/2020   ? Subdural hematoma (HCC) 08/15/2020   ? Permanent atrial fibrillation (HCC) 06/29/2020   ? Osteoarthrosis, unspecified whether generalized or localized, lower leg 03/11/2012   ? Primary localized osteoarthrosis, lower leg 12/18/2011   ? Pain in joint, lower leg 12/18/2011   ? S/P knee replacement 12/18/2011   ? Knee pain 10/25/2011   ? Osteoarthritis of knee 10/25/2011   ? 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 CAD (coronary artery disease) 11/11/2008     status post CABG on 06/18/2005 (patient received a LIMA to      LAD, reverse SVG to OM branch, reverse SVG to mid-RCA and reverse SVG to second      diagonal).  Patient is angina free and he is a functional Class I.  History of chest pain and admission in [facility] in 11/2007.  Exercise myocardial perfusion imaging study dated 11/13/2007, unremarkable  for      ischemia.     ? Obesity 11/11/2008   ? Hyperlipidemia 11/11/2008   ? Hypertension, essential 11/11/2008         Review of Systems   Constitutional: Negative.   HENT: Negative.    Eyes: Negative.    Cardiovascular: Negative.    Respiratory: Negative.    Endocrine: Negative.    Hematologic/Lymphatic: Negative.    Skin: Negative.    Musculoskeletal: Negative.    Gastrointestinal: Negative.    Genitourinary: Negative.    Neurological: Negative.    Psychiatric/Behavioral: Negative.    All other systems reviewed and are negative.      Physical Exam  General Appearance: no acute distress  Skin: warm & intact  HEENT: unremarkable  Neck Veins: neck veins are flat & not distended  Carotid Arteries: no bruits  Chest Inspection: chest is normal in appearance  Auscultation/Percussion: lungs clear to auscultation, no rales, rhonchi, or wheezing  Cardiac Rhythm: regular rhythm & normal rate  Cardiac Auscultation: Normal S1 & S2, no S3 or S4, no rub  Murmurs: no cardiac murmurs   Extremities: trace bilateral lower extremity edema; 2+ symmetric distal pulses  Abdominal Exam: soft, non-tender, no masses, bowel sounds normal  Liver & Spleen: no organomegaly  Neurologic Exam: oriented to time, place and person; no focal neurologic deficits  Psychiatric: Normal mood and affect.  Behavior is normal. Judgment and thought content normal.         Cardiovascular Studies  Preliminary EKG:    Afib, rate 66 bpm.      Cardiovascular Health Factors  Vitals BP Readings from Last 3 Encounters:   02/14/22 (!) 150/80   02/14/22 (!) 155/85   11/21/21 136/86     Wt Readings from Last 3 Encounters:   02/14/22 107 kg (236 lb)   02/14/22 107.3 kg (236 lb 9.6 oz)   11/21/21 110.7 kg (244 lb)     BMI Readings from Last 3 Encounters:   02/14/22 30.30 kg/m?   02/14/22 30.38 kg/m?   11/21/21 31.33 kg/m?      Smoking Social History     Tobacco Use   Smoking Status Never   Smokeless Tobacco Never      Lipid Profile Cholesterol   Date Value Ref Range Status   08/14/2021 92 <200 MG/DL Final     HDL   Date Value Ref Range Status   08/14/2021 34 (L) >40 MG/DL Final     LDL   Date Value Ref Range Status   08/14/2021 45 <100 mg/dL Final     Triglycerides   Date Value Ref Range Status   08/14/2021 61 <150 MG/DL Final      Blood Sugar Hemoglobin A1C   Date Value Ref Range Status   08/14/2021 7.9 (H) 4.0 - 5.7 % Final     Comment:     The ADA recommends that most patients with type 1 and type 2 diabetes maintain   an A1c level <7%.       Glucose   Date Value Ref Range Status   02/14/2022 126 (H) 70 - 100 MG/DL Final   16/03/9603 540 (H) 70 - 105    08/24/2021 147 (H) 70 - 105 Final   06/21/2005 129 (H) 70 - 110 MG/DL Final   98/04/9146 95 70 - 110 MG/DL Final   82/95/6213 086 (H) 70 - 110 MG/DL Final Glucose, POC   Date Value Ref Range Status   08/18/2021 193 (H) 70 -  100 MG/DL Final   75/03/2584 277 (H) 70 - 100 MG/DL Final   82/42/3536 144 (H) 70 - 100 MG/DL Final          Problems Addressed Today  Encounter Diagnoses   Name Primary?   ? Nonrheumatic aortic valve stenosis Yes   ? S/p TAVR (transcatheter aortic valve replacement), bioprosthetic    ? Cardiovascular symptoms    ? S/P CABG (coronary artery bypass graft)    ? Coronary artery disease involving native coronary artery of native heart without angina pectoris    ? Chronic diastolic heart failure (HCC)    ? Hypertension, essential    ? Permanent atrial fibrillation (HCC)        Assessment and Plan     1.  Aortic stenosis status post TAVR with a 26 SAPIEN valve in August 2022.  He had an echocardiogram today which revealed a well-seated valve with no stenosis or regurgitation.  The mean gradient is 13 mmHg.  He had symptomatic improvement following the procedure and continues to participate in cardiac rehab.  Needs to continue aspirin and SBE prophylaxis lifelong.    2.  Chronic diastolic heart failure.  He is NYHA class I-II heart failure symptoms.  He has no evidence of volume overload on exam.    3.  Hypertension.  His blood pressure is elevated in clinic today but is being monitored at cardiac rehab and he checks it at home as well and it is usually within normal limits.    4.  Coronary disease status post CABG.  He denies anginal symptoms.  His last cath revealed patent grafts.    5.  Permanent A-fib he is not on anticoagulation because of a left atrial appendage ligation done at the time of CABG.    6.  Tricuspid regurgitation.  Its moderate to severe on echo today.  If he starts to develop more heart failure symptoms we could consider evaluation for tricuspid intervention.  We will defer for now however.    He should continue to follow-up with Dr. Arna Medici at [facility].  He should have an echocardiogram once a year.  Thank you for allowing [place] to participate in the care of this pleasant individual.  If you have any other questions or concerns, please do not hesitate to contact us.    Sherrlyn Hock, APRN  Structural Heart Nurse Practitioner  Pager [phone]           Current Medications (including today's revisions)  ? acetaminophen (TYLENOL) 325 mg tablet Take two tablets by mouth every 6 hours as needed.   ? aspirin EC (ASPIR-LOW) 81 mg tablet Take one tablet by mouth daily. Take with food.   ? fluticasone propionate (FLONASE) 50 mcg/actuation nasal spray, suspension Apply two sprays to each nostril as directed as Needed.   ? losartan (COZAAR) 25 mg tablet Take one tablet by mouth daily. (Patient taking differently: Take four tablets by mouth daily.)   ? metoprolol succinate XL (TOPROL XL) 25 mg extended release tablet Take one tablet by mouth daily.   ? nitroglycerin (NITROSTAT) 0.4 mg tablet Place 1 Tab under tongue every 5 minutes as needed for Chest Pain.   ? pioglitazone (ACTOS) 30 mg tablet Take one tablet by mouth daily.   ? potassium chloride (KLOR-CON 10) 10 mEq tablet Take one tablet by mouth daily with food.   ? simvastatin (ZOCOR) 20 mg tablet TAKE ONE TABLET BY MOUTH AT BEDTIME (Patient taking differently: Take one tablet  by mouth at bedtime daily.)   ? spironolactone (ALDACTONE) 25 mg tablet Take one-half tablet by mouth twice daily. Take with food.   ? torsemide (SOAANZ) 40 mg tablet Take one tablet by mouth twice daily.

## 2022-07-24 ENCOUNTER — Encounter: Admit: 2022-07-24 | Discharge: 2022-07-24 | Payer: MEDICARE

## 2022-07-24 ENCOUNTER — Ambulatory Visit: Admit: 2022-07-24 | Discharge: 2022-07-25 | Payer: MEDICARE

## 2022-07-24 DIAGNOSIS — E785 Hyperlipidemia, unspecified: Secondary | ICD-10-CM

## 2022-07-24 DIAGNOSIS — I272 Pulmonary hypertension, unspecified: Secondary | ICD-10-CM

## 2022-07-24 DIAGNOSIS — E119 Type 2 diabetes mellitus without complications: Secondary | ICD-10-CM

## 2022-07-24 DIAGNOSIS — I361 Nonrheumatic tricuspid (valve) insufficiency: Secondary | ICD-10-CM

## 2022-07-24 DIAGNOSIS — I2510 Atherosclerotic heart disease of native coronary artery without angina pectoris: Secondary | ICD-10-CM

## 2022-07-24 DIAGNOSIS — E669 Obesity, unspecified: Secondary | ICD-10-CM

## 2022-07-24 DIAGNOSIS — Z953 Presence of xenogenic heart valve: Secondary | ICD-10-CM

## 2022-07-24 DIAGNOSIS — I10 Essential (primary) hypertension: Secondary | ICD-10-CM

## 2022-07-24 DIAGNOSIS — I4821 Permanent atrial fibrillation: Secondary | ICD-10-CM

## 2022-07-24 DIAGNOSIS — M1990 Unspecified osteoarthritis, unspecified site: Secondary | ICD-10-CM

## 2022-07-24 DIAGNOSIS — Z951 Presence of aortocoronary bypass graft: Secondary | ICD-10-CM

## 2022-07-24 DIAGNOSIS — I4891 Unspecified atrial fibrillation: Secondary | ICD-10-CM

## 2022-07-24 DIAGNOSIS — S065XAA Subdural hematoma (HCC): Secondary | ICD-10-CM

## 2022-07-24 DIAGNOSIS — R569 Unspecified convulsions: Secondary | ICD-10-CM

## 2022-07-24 NOTE — Patient Instructions
Your heart and vascular findings are doing very well.  Continue with your current exercise regimen and medication regimen.  Contact our office any questions or concerns.  Routine follow-up can be in 6 to 12 months.

## 2022-07-24 NOTE — Progress Notes
Date of Service: 07/24/2022    Fernando [name] is a 88 y.o. male.       HPI     Mr. Obie Dredge, an 88 year old gentleman with a complex cardiac history, presents for a routine follow-up. His cardiac history is significant for severe aortic valve stenosis, permanent atrial fibrillation, and heart failure with preserved ejection fraction. He underwent CABG and ligation of his left atrial appendage in 2007, and TAVR in August 2022. He was hospitalized in March 2023 due to heart failure and COVID-19 infection, requiring extensive diuresis. Since then, he has been enrolled in cardiac rehab and has been doing well. He also has a history of diabetes mellitus, peripheral vascular disease, hypertension, and a previous subdural hematoma. He does not report any new or worsening symptoms at this time. He remains active and participates in cardiac rehab twice a week. He does not report any lightheadedness, syncope, or palpitations. He does note that he becomes lightheaded if he is bent over for a prolonged period of time, but this resolves quickly upon standing.         Vitals:    07/24/22 1032   BP: 138/62   BP Source: Arm, Left Upper   Pulse: 71   SpO2: 98%   O2 Device: None (Room air)   PainSc: Zero   Weight: 113.4 kg (250 lb 1.6 oz)   Height: 188 cm (6' 2)     Body mass index is 32.11 kg/m?[name]     Past Medical History  Patient Active Problem List    Diagnosis Date Noted    Nonrheumatic tricuspid valve regurgitation 02/14/2022    Heart failure (HCC) 08/13/2021    S/p TAVR (transcatheter aortic valve replacement), bioprosthetic 01/12/2021     01/12/21 Dr. Helen Hashimoto and Dr. Riley Nearing      Hypokalemia 01/12/2021    Hypomagnesemia 01/12/2021    Nonrheumatic aortic valve stenosis 12/29/2020    Acute on chronic combined systolic and diastolic congestive heart failure, NYHA class 2 (HCC) 12/29/2020    Stage 3a chronic kidney disease (HCC) 12/29/2020    Venous stasis of lower extremity 12/29/2020    Pulmonary HTN (HCC) 11/24/2020    Chronic diastolic heart failure (HCC) 11/24/2020    Subdural hematoma (HCC) 08/15/2020    Permanent atrial fibrillation (HCC) 06/29/2020    Osteoarthrosis, unspecified whether generalized or localized, lower leg 03/11/2012    Primary localized osteoarthrosis, lower leg 12/18/2011    Pain in joint, lower leg 12/18/2011    S/P knee replacement 12/18/2011    Knee pain 10/25/2011    Osteoarthritis of knee 10/25/2011    S/P CABG (coronary artery bypass graft) 09/15/2010    CAD (coronary artery disease) 11/11/2008     status post CABG on 06/18/2005 (patient received a LIMA to      LAD, reverse SVG to OM branch, reverse SVG to mid-RCA and reverse SVG to second      diagonal).  Patient is angina free and he is a functional Class I.  History of chest pain and admission in [facility] in 11/2007.  Exercise myocardial perfusion imaging study dated 11/13/2007, unremarkable for      ischemia.      Obesity 11/11/2008    Hyperlipidemia 11/11/2008    Hypertension, essential 11/11/2008         Review of Systems   Constitutional: Negative.   HENT: Negative.     Eyes: Negative.    Cardiovascular: Negative.    Respiratory: Negative.     Endocrine: Negative.  Hematologic/Lymphatic: Negative.    Skin: Negative.    Musculoskeletal: Negative.    Gastrointestinal: Negative.    Genitourinary: Negative.    Neurological: Negative.    Psychiatric/Behavioral: Negative.     Allergic/Immunologic: Negative.        Physical Exam  Awake alert, well-nourished gentleman who is well-kept  Pupils are equal round without scleral injection  Neck is supple no carotid upstroke and no bruits or JVD  Chest is symmetric and well-healed with lungs are clear to auscultation  Heart is S1, S2 is normal.  Does have a 2/6 systolic murmur  Abdomen is mildly protuberant but soft  Pulses are 2+, irregular but symmetric at radial and pedal location  Does have trace edema just at the ankles with significant decrease in hair growth.  Skin is somewhat thin but intact with purpleish discoloration in the pretibial locations on the left  Symmetric muscle tone    Cardiovascular Studies      Cardiovascular Health Factors  Vitals BP Readings from Last 3 Encounters:   07/24/22 138/62   02/14/22 (!) 155/85   02/14/22 (!) 150/80     Wt Readings from Last 3 Encounters:   07/24/22 113.4 kg (250 lb 1.6 oz)   02/14/22 107.3 kg (236 lb 9.6 oz)   02/14/22 107 kg (236 lb)     BMI Readings from Last 3 Encounters:   07/24/22 32.11 kg/m?   02/14/22 30.38 kg/m?   02/14/22 30.30 kg/m?      Smoking Social History     Tobacco Use   Smoking Status Never   Smokeless Tobacco Never      Lipid Profile Cholesterol   Date Value Ref Range Status   08/14/2021 92 <200 MG/DL Final     HDL   Date Value Ref Range Status   08/14/2021 34 (L) >40 MG/DL Final     LDL   Date Value Ref Range Status   08/14/2021 45 <100 mg/dL Final     Triglycerides   Date Value Ref Range Status   08/14/2021 61 <150 MG/DL Final      Blood Sugar Hemoglobin A1C   Date Value Ref Range Status   08/14/2021 7.9 (H) 4.0 - 5.7 % Final     Comment:     The ADA recommends that most patients with type 1 and type 2 diabetes maintain   an A1c level <7%.       Glucose   Date Value Ref Range Status   02/14/2022 126 (H) 70 - 100 MG/DL Final   16/03/9603 540 (H) 70 - 105    08/24/2021 147 (H) 70 - 105 Final   06/21/2005 129 (H) 70 - 110 MG/DL Final   98/04/9146 95 70 - 110 MG/DL Final   82/95/6213 086 (H) 70 - 110 MG/DL Final     Glucose, POC   Date Value Ref Range Status   08/18/2021 193 (H) 70 - 100 MG/DL Final   57/84/6962 952 (H) 70 - 100 MG/DL Final   84/13/2440 102 (H) 70 - 100 MG/DL Final          Problems Addressed Today  Encounter Diagnoses   Name Primary?    S/p TAVR (transcatheter aortic valve replacement), bioprosthetic Yes    S/P CABG (coronary artery bypass graft)     Pulmonary HTN (HCC)     Subdural hematoma (HCC)     Permanent atrial fibrillation (HCC)     Nonrheumatic tricuspid valve regurgitation  Assessment and Plan     Mr. Obie Dredge, an 88 year old gentleman with a complex cardiac history, presents for a routine follow-up. His cardiac history is significant for severe aortic valve stenosis, permanent atrial fibrillation, and heart failure with preserved ejection fraction. He underwent CABG and ligation of his left atrial appendage in 2007, and TAVR in August 2022. He was hospitalized in March 2023 due to heart failure and COVID-19 infection, requiring extensive diuresis. Since then, he has been enrolled in cardiac rehab and has been doing well. He also has a history of diabetes mellitus, peripheral vascular disease, hypertension, and a previous subdural hematoma. He does not report any new or worsening symptoms at this time. He remains active and participates in cardiac rehab twice a week. He does not report any lightheadedness, syncope, or palpitations. He does note that he becomes lightheaded if he is bent over for a prolonged period of time, but this resolves quickly upon standing.         Current Medications (including today's revisions)   acetaminophen (TYLENOL) 325 mg tablet Take two tablets by mouth every 6 hours as needed.    aspirin EC (ASPIR-LOW) 81 mg tablet Take one tablet by mouth daily. Take with food.    fluticasone propionate (FLONASE) 50 mcg/actuation nasal spray, suspension Apply two sprays to each nostril as directed as Needed.    losartan (COZAAR) 25 mg tablet Take one tablet by mouth daily. (Patient taking differently: Take four tablets by mouth daily.)    metoprolol succinate XL (TOPROL XL) 25 mg extended release tablet Take one tablet by mouth daily.    nitroglycerin (NITROSTAT) 0.4 mg tablet Place 1 Tab under tongue every 5 minutes as needed for Chest Pain.    pioglitazone (ACTOS) 30 mg tablet Take one tablet by mouth daily.    potassium chloride (KLOR-CON 10) 10 mEq tablet Take one tablet by mouth daily with food.    simvastatin (ZOCOR) 20 mg tablet TAKE ONE TABLET BY MOUTH AT BEDTIME (Patient taking differently: Take one tablet by mouth at bedtime daily.)    spironolactone (ALDACTONE) 25 mg tablet Take one-half tablet by mouth twice daily. Take with food.    torsemide (SOAANZ) 40 mg tablet Take one tablet by mouth twice daily.

## 2022-09-18 ENCOUNTER — Encounter: Admit: 2022-09-18 | Discharge: 2022-09-18 | Payer: MEDICARE

## 2022-09-18 MED ORDER — FUROSEMIDE 40 MG PO TAB
ORAL_TABLET | 3 refills
Start: 2022-09-18 — End: ?

## 2022-10-21 ENCOUNTER — Encounter: Admit: 2022-10-21 | Discharge: 2022-10-21 | Payer: MEDICARE

## 2022-10-21 MED ORDER — FUROSEMIDE 40 MG PO TAB
ORAL_TABLET | 3 refills
Start: 2022-10-21 — End: ?

## 2022-11-08 ENCOUNTER — Encounter: Admit: 2022-11-08 | Discharge: 2022-11-08 | Payer: MEDICARE

## 2023-01-23 ENCOUNTER — Encounter: Admit: 2023-01-23 | Discharge: 2023-01-23 | Payer: MEDICARE

## 2023-01-24 ENCOUNTER — Ambulatory Visit: Admit: 2023-01-24 | Discharge: 2023-01-25 | Payer: MEDICARE

## 2023-01-24 ENCOUNTER — Encounter: Admit: 2023-01-24 | Discharge: 2023-01-24 | Payer: MEDICARE

## 2023-01-24 DIAGNOSIS — E782 Mixed hyperlipidemia: Secondary | ICD-10-CM

## 2023-01-24 DIAGNOSIS — I10 Essential (primary) hypertension: Secondary | ICD-10-CM

## 2023-01-24 DIAGNOSIS — I2510 Atherosclerotic heart disease of native coronary artery without angina pectoris: Secondary | ICD-10-CM

## 2023-01-24 DIAGNOSIS — I5033 Acute on chronic diastolic (congestive) heart failure: Secondary | ICD-10-CM

## 2023-01-24 DIAGNOSIS — M1990 Unspecified osteoarthritis, unspecified site: Secondary | ICD-10-CM

## 2023-01-24 DIAGNOSIS — Z951 Presence of aortocoronary bypass graft: Secondary | ICD-10-CM

## 2023-01-24 DIAGNOSIS — E669 Obesity, unspecified: Secondary | ICD-10-CM

## 2023-01-24 DIAGNOSIS — E119 Type 2 diabetes mellitus without complications: Secondary | ICD-10-CM

## 2023-01-24 DIAGNOSIS — I4891 Unspecified atrial fibrillation: Secondary | ICD-10-CM

## 2023-01-24 DIAGNOSIS — R569 Unspecified convulsions: Secondary | ICD-10-CM

## 2023-01-24 DIAGNOSIS — E785 Hyperlipidemia, unspecified: Secondary | ICD-10-CM

## 2023-01-24 NOTE — Patient Instructions
Thank you for visiting our office today.    We would like to make the following medication adjustments:  NONE       Otherwise continue the same medications as you have been doing.          We will be pursuing the following tests after your appointment today:       Orders Placed This Encounter    BASIC METABOLIC PANEL     Labs when you are able    We will plan to see you back in 6 months.  Please call us in the meantime with any questions or concerns.        Please allow 5-7 business days for our providers to review your results. All normal results will go to MyChart. If you do not have Mychart, it is strongly recommended to get this so you can easily view all your results. If you do not have mychart, we will attempt to call you once with normal lab and testing results. If we cannot reach you by phone with normal results, we will send you a letter.  If you have not heard the results of your testing after one week please give [place] a call.       Your Cardiovascular [facility] [place] Team Brett Canales, Pilar Jarvis, Shawna Orleans, and [place])  phone number is [phone].

## 2023-01-24 NOTE — Progress Notes
Date of Service: 01/24/2023    ZAKORY ZUCCONI is a 88 y.o. male.       HPI   Mr. Rudnik  has been followed for severe aortic valve stenosis, permanent atrial fibrillation and heart failure with preserved ejection fraction.  He has done very well over the past year without any exacerbations of heart failure.  He has done extremely well on his current diuretic regimen. He is trying to watch his salt intake a lot better. He continues going to cardiac rehab on Tuesdays and Thursdays in [place] where he knows the physical therapist very well.  He walks on the treadmill for 20 minutes and uses the NuStep machine for 20 minutes.   He used to have recurrent pretibial ulcers but those have resolved since his edema has been so well-controlled.  I noticed 1 emergency room visit on 11/18/2022 after a fall in his garage.  He struck his knees and his face but did not require hospitalization.  Otherwise, the patient reports that he continues to feel well and has no complaints.  The patient reports no angina, palpitations, sensation of sustained forceful heart pounding, lightheadedness or syncope. The patient reports no myalgias, claudication, bleeding abnormalities, or new neurologic abnormalities.  His CHA2DS2-VASc score is 5 for age, diabetes, peripheral vascular disease and hypertension.  He has had prior surgical ligation of his left atrial appendage.   Historically,  Mr. Aborn has been followed in the past for coronary artery disease, having undergone coronary bypass surgery in 2007.  He has also been followed for permanent atrial fibrillation.  He fell in December 2021.  He then developed abnormalities in balance and evaluation revealed a subdural hematoma.  He did have a seizure.  He was taken off his aspirin and Eliquis and surgical intervention was not required. He has had prior surgical ligation of his left atrial appendage. on May 24, 2016 during a routine visit, a routine ECG revealed atrial fibrillation. At that time he reported no palpitations or sensation of sustained forceful heart pounding.   He did not want to consider cardioversion, antiarrhythmic therapy or arrhythmia ablation and excepted permanent atrial fibrillation.[name] He deferred anticoagulation until November 2021 when he started apixaban.  Mr. Coby also fell while cleaning his driveway drain on Nov 02 2020.  This required 3 staples in the back of his head.  Mr. Omar had severe aortic valve stenosis and underwent transcatheter aortic valve replacement on 01/12/2021 receiving a 26 mm S3 valve.   He was hospitalized from March 5 until August 18, 2021 for heart failure and also had COVID at the time.  He was diuresed 25 pounds.       Vitals:    01/24/23 1251   BP: 129/70   BP Source: Arm, Left Upper   Pulse: 82   SpO2: 97%   O2 Device: None (Room air)   PainSc: Zero   Weight: 104.2 kg (229 lb 12.8 oz)   Height: 188 cm (6' 2)     Body mass index is 29.5 kg/m?[name]     Past Medical History  Patient Active Problem List    Diagnosis Date Noted    Nonrheumatic tricuspid valve regurgitation 02/14/2022    Heart failure (HCC) 08/13/2021    S/p TAVR (transcatheter aortic valve replacement), bioprosthetic 01/12/2021     01/12/21 Dr. Helen Hashimoto and Dr. Riley Nearing      Hypokalemia 01/12/2021    Hypomagnesemia 01/12/2021    Nonrheumatic aortic valve stenosis 12/29/2020    Acute on  chronic combined systolic and diastolic congestive heart failure, NYHA class 2 (HCC) 12/29/2020    Stage 3a chronic kidney disease (HCC) 12/29/2020    Venous stasis of lower extremity 12/29/2020    Pulmonary HTN (HCC) 11/24/2020    Chronic diastolic heart failure (HCC) 11/24/2020    Subdural hematoma (HCC) 08/15/2020    Permanent atrial fibrillation (HCC) 06/29/2020    Osteoarthrosis, unspecified whether generalized or localized, lower leg 03/11/2012    Primary localized osteoarthrosis, lower leg 12/18/2011    Pain in joint, lower leg 12/18/2011    S/P knee replacement 12/18/2011    Knee pain 10/25/2011 Osteoarthritis of knee 10/25/2011    S/P CABG (coronary artery bypass graft) 09/15/2010    CAD (coronary artery disease) 11/11/2008     status post CABG on 06/18/2005 (patient received a LIMA to      LAD, reverse SVG to OM branch, reverse SVG to mid-RCA and reverse SVG to second      diagonal).  Patient is angina free and he is a functional Class I.  History of chest pain and admission in [facility] in 11/2007.  Exercise myocardial perfusion imaging study dated 11/13/2007, unremarkable for      ischemia.      Obesity 11/11/2008    Hyperlipidemia 11/11/2008    Hypertension, essential 11/11/2008         Review of Systems   Constitutional: Negative.   HENT: Negative.     Eyes: Negative.    Cardiovascular: Negative.    Respiratory: Negative.     Endocrine: Negative.    Hematologic/Lymphatic: Negative.    Skin: Negative.    Musculoskeletal: Negative.    Gastrointestinal: Negative.    Genitourinary: Negative.    Neurological: Negative.    Psychiatric/Behavioral: Negative.     Allergic/Immunologic: Negative.        Physical Exam    GENERAL: The patient is well developed, well nourished, resting comfortably and in no distress.   HEENT: No abnormalities of the visible oro-nasopharynx, conjunctiva or sclera are noted.  NECK: There is no jugular venous distension. Carotids are palpable and without bruits. There is no thyroid enlargement.  Chest: Lung fields are clear to auscultation. There are no wheezes or crackles.  CV: There is a regular rhythm. The first and second heart sounds are normal.  A soft systolic ejection murmur is heard.  There are no diastolic murmurs, gallops or rubs.  ABD: The abdomen is soft and supple with normal bowel sounds. There is no hepatosplenomegaly, ascites, tenderness, masses or bruits.  Neuro: There are no focal motor defects. Ambulation is normal. Cognitive function appears normal.  Ext: There is trace to 1+  bilateral lower extremity edema  evidence of deep vein thrombosis. Peripheral pulses are satisfactory.    SKIN: There are no rashes and no cellulitis.  Chronic venous stasis dermatitis bilaterally on pretibial surfaces without any wounds.  He is wearing supportive stockings around his forelegs but not his feet.  PSYCH: The patient is calm, rationale and oriented.  Cardiovascular Studies  A twelve-lead ECG obtained on 02/14/2022 reveals atrial fibrillation with a heart rate of 66 bpm.  Right bundle branch block is noted along with nonspecific ST-T wave abnormalities.    Labs from 10/31/2022 reveal hemoglobin 13.8.  Potassium 4.0 mmol/L and serum creatinine 2.0 mg/dL.  Total cholesterol = 151, triglycerides = 83, HDL = 36 and LDL = 99.  TSH = 1.60 ALT = 11.    Echo Doppler 02/14/2022:  Interpretation Summary  Normal  sized left ventricle.  Moderate concentric LV hypertrophy with severe basal septal hypertrophy.  Normal left ventricular systolic function with estimated ejection fraction of 57% by biplane Simpson's method. No regional wall motion abnormalities.   Grade 2 diastolic dysfunction with elevated estimated left atrial pressure.  Severely dilated right ventricle.  Mildly reduced ventricular systolic function.  Moderately dilated left atrium.  Severely dilated right atrium.  Dilated IVC with normal compressibility.  26 mm S3 bioprosthetic valve present in aortic position with no evidence of stenosis or regurgitation.  Peak velocity of 2.4 m/s and mean gradient of 13 mmHg.  Mild to moderate mitral regurgitation.  Moderate to likely severe tricuspid regurgitation with hepatic vein flow reversal seen.  Moderate to severe pulmonic regurgitation.    Moderate to severe pulmonary hypertension with estimated PA systolic pressure of 69 mmHg  No pericardial effusion.     Please see full report for additional details.    Prior study for comparison: 08/15/2021.  No major changes compared to prior echo report.  Cardiovascular Health Factors  Vitals BP Readings from Last 3 Encounters:   01/24/23 129/70 07/24/22 138/62   02/14/22 (!) 155/85     Wt Readings from Last 3 Encounters:   01/24/23 104.2 kg (229 lb 12.8 oz)   07/24/22 113.4 kg (250 lb 1.6 oz)   02/14/22 107.3 kg (236 lb 9.6 oz)     BMI Readings from Last 3 Encounters:   01/24/23 29.50 kg/m?   07/24/22 32.11 kg/m?   02/14/22 30.38 kg/m?      Smoking Social History     Tobacco Use   Smoking Status Never   Smokeless Tobacco Never      Lipid Profile Cholesterol   Date Value Ref Range Status   10/31/2022 151  Final     HDL   Date Value Ref Range Status   10/31/2022 36 (L) >=40 Final     LDL   Date Value Ref Range Status   10/31/2022 99  Final     Triglycerides   Date Value Ref Range Status   10/31/2022 83  Final      Blood Sugar Hemoglobin A1C   Date Value Ref Range Status   08/14/2021 7.9 (H) 4.0 - 5.7 % Final     Comment:     The ADA recommends that most patients with type 1 and type 2 diabetes maintain   an A1c level <7%.       Glucose   Date Value Ref Range Status   10/31/2022 146 (H) 70 - 105 Final   02/14/2022 126 (H) 70 - 100 MG/DL Final   34/74/2595 638 (H) 70 - 105    06/21/2005 129 (H) 70 - 110 MG/DL Final   75/64/3329 95 70 - 110 MG/DL Final   51/88/4166 063 (H) 70 - 110 MG/DL Final     Glucose, POC   Date Value Ref Range Status   08/18/2021 193 (H) 70 - 100 MG/DL Final   01/60/1093 235 (H) 70 - 100 MG/DL Final   57/32/2025 427 (H) 70 - 100 MG/DL Final          Problems Addressed Today  Encounter Diagnoses   Name Primary?    Coronary artery disease involving native coronary artery of native heart without angina pectoris Yes    Acute on chronic diastolic (congestive) heart failure (HCC)     Morbid (severe) obesity due to excess calories (HCC)     Mixed hyperlipidemia     S/P CABG (  coronary artery bypass graft)     Hypertension, essential        Assessment and Plan   Mr. Janeann Merl appears to be doing very well.  He reports no angina and his congestive heart failure appears extremely well compensated.  He appears to be restricting his salt and his diuretic regimen looks very appropriate.  I have asked him to check his Chem-7 to reassess his electrolytes and renal function.  Fall precautions were strongly reinforced.  I have asked him to return for follow-up in 6 months time. The total time spent during this interview and exam with preparation and chart review was 30 minutes.         Current Medications (including today's revisions)   acetaminophen (TYLENOL) 325 mg tablet Take two tablets by mouth every 6 hours as needed.    aspirin EC (ASPIR-LOW) 81 mg tablet Take one tablet by mouth daily. Take with food.    fluticasone propionate (FLONASE) 50 mcg/actuation nasal spray, suspension Apply two sprays to each nostril as directed as Needed.    furosemide (LASIX) 40 mg tablet TAKE 1 TABLET EVERY MORNING    JANUVIA 50 mg tablet Take one tablet by mouth daily.    losartan (COZAAR) 25 mg tablet Take one tablet by mouth daily. (Patient taking differently: Take four tablets by mouth daily.)    metOLazone (ZAROXOLYN) 2.5 mg tablet Take one tablet by mouth. Takes MWF    metoprolol succinate XL (TOPROL XL) 25 mg extended release tablet Take one tablet by mouth daily.    nitroglycerin (NITROSTAT) 0.4 mg tablet Place 1 Tab under tongue every 5 minutes as needed for Chest Pain.    pioglitazone (ACTOS) 30 mg tablet Take one tablet by mouth daily.    potassium chloride (KLOR-CON 10) 10 mEq tablet Take one tablet by mouth daily with food.    simvastatin (ZOCOR) 20 mg tablet TAKE ONE TABLET BY MOUTH AT BEDTIME (Patient taking differently: Take one tablet by mouth at bedtime daily.)    spironolactone (ALDACTONE) 25 mg tablet Take one-half tablet by mouth twice daily. Take with food.

## 2023-02-21 ENCOUNTER — Encounter: Admit: 2023-02-21 | Discharge: 2023-02-21 | Payer: MEDICARE

## 2023-02-21 MED ORDER — SPIRONOLACTONE 25 MG PO TAB
ORAL_TABLET | ORAL | 3 refills | 90.00000 days | Status: AC
Start: 2023-02-21 — End: ?

## 2023-03-22 IMAGING — CT BRAIN WO(Adult)
3 of 4 series · 14 of 47 positions shown, 16 images · non-contrast
Comparison: none

[Series 4: brain cor 5.00 hr40 s3 · coronal · 0.34mm/px · 3 of 46 slices shown]
[im 16/46  brain]
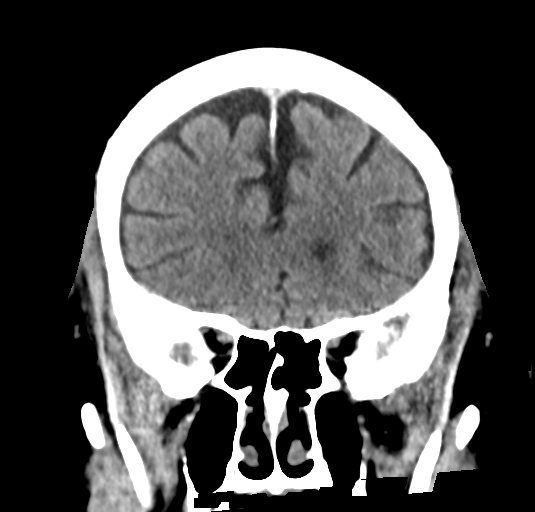
[im 21/46  brain]
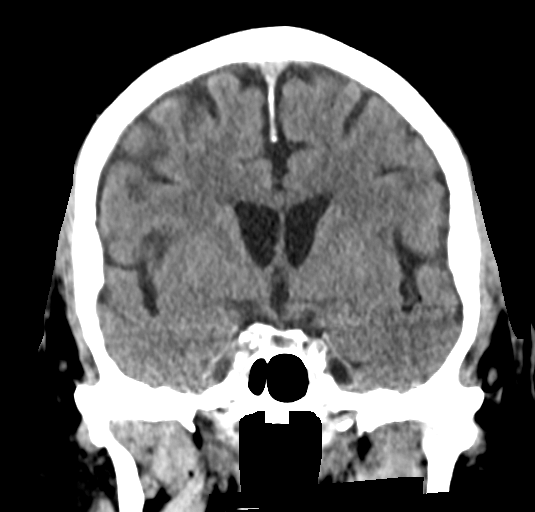
[im 26/46  brain]
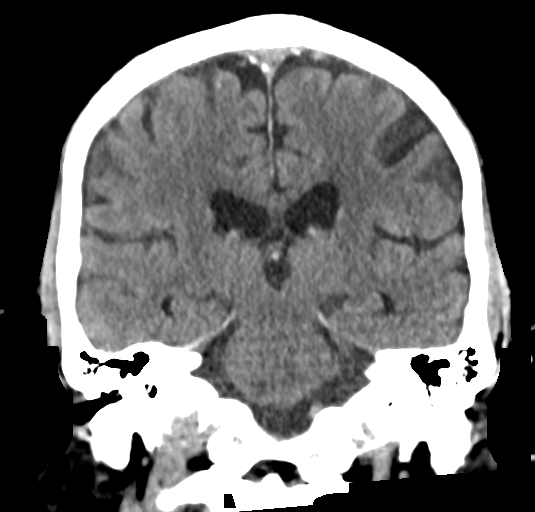

[Series 6: brain sag 5.00 hr40 s3 · sagittal · 0.34mm/px · 3 of 36 slices shown]
[im 12/36  brain]
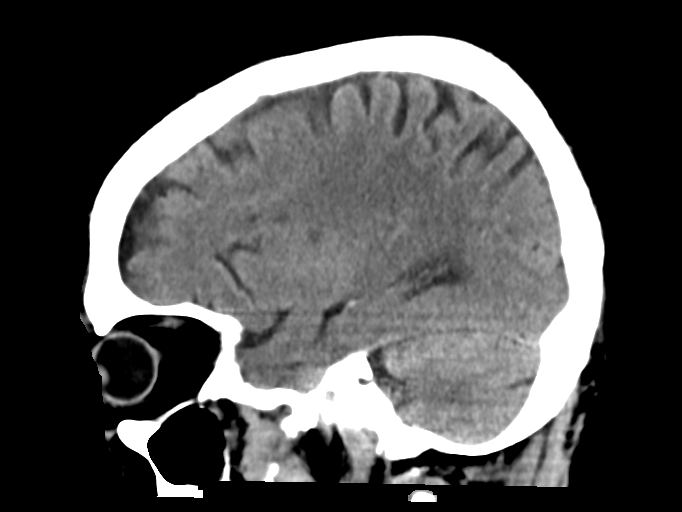
[im 18/36  brain]
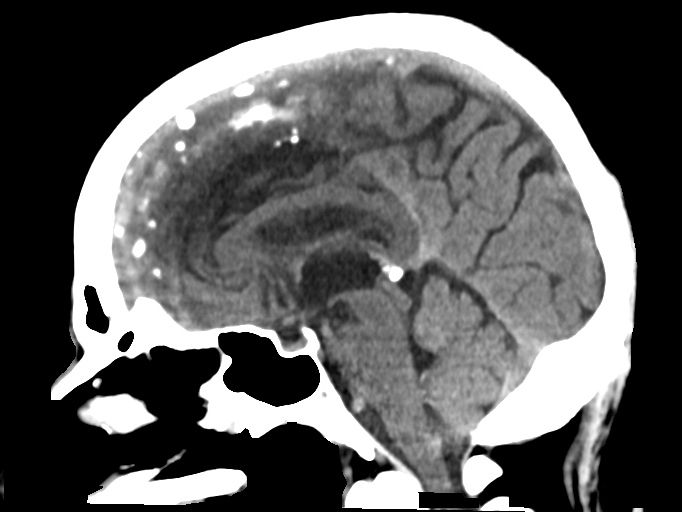
[im 24/36  brain]
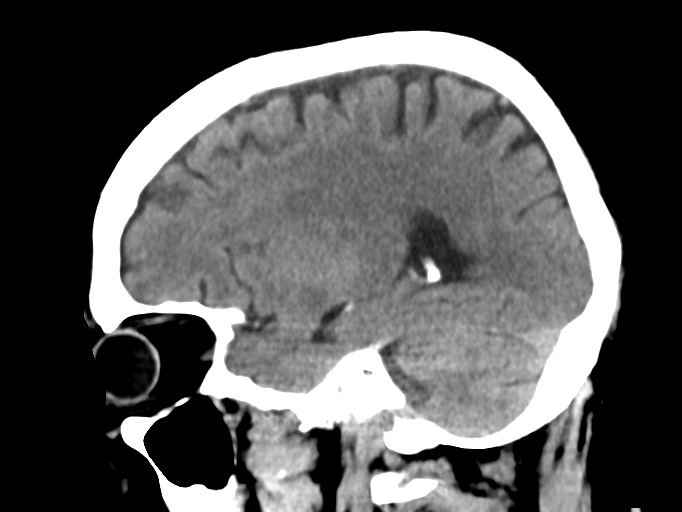

[Series 8: brain ax 2.00 hr60 s3 · axial · 0.35mm/px · z∈[-464,-326]mm · 8 of 87 slices shown, 10 images]
[im 9/87  brain]
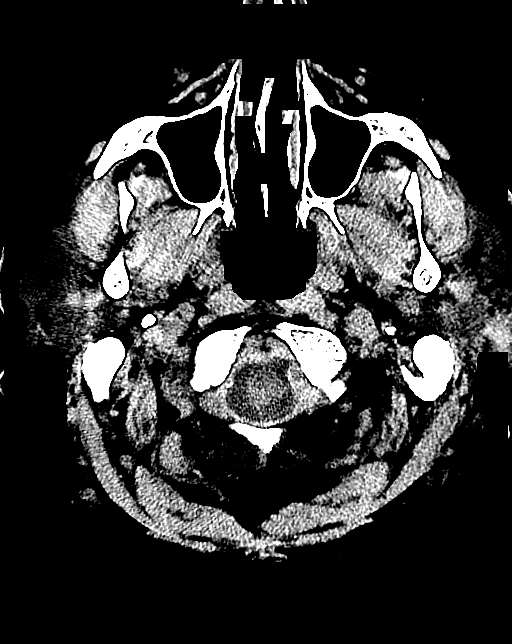
[im 9/87  bone]
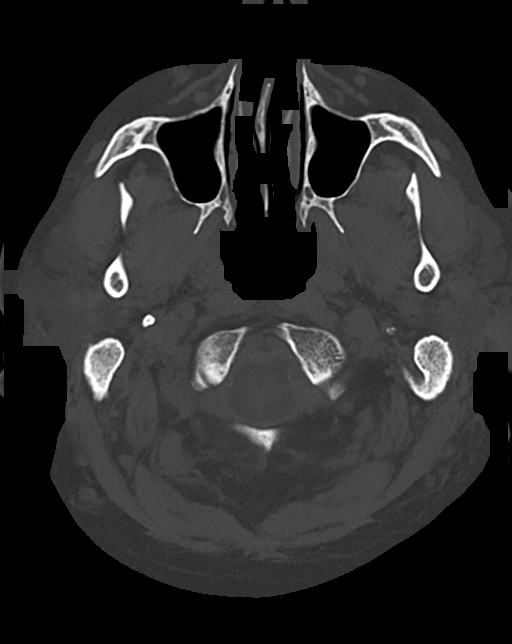
[im 17/87  brain]
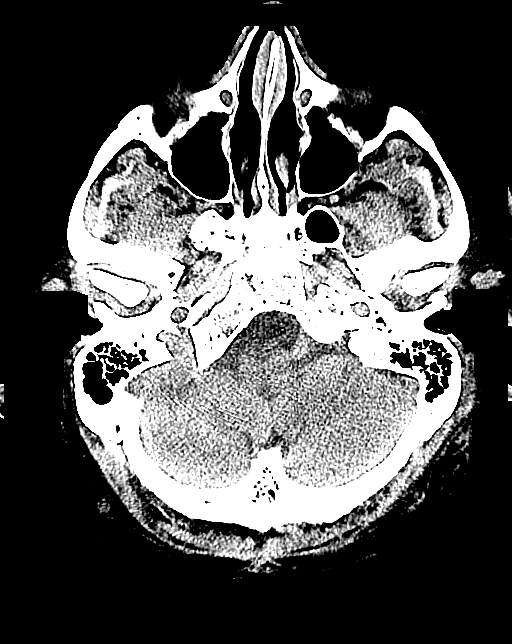
[im 29/87  brain]
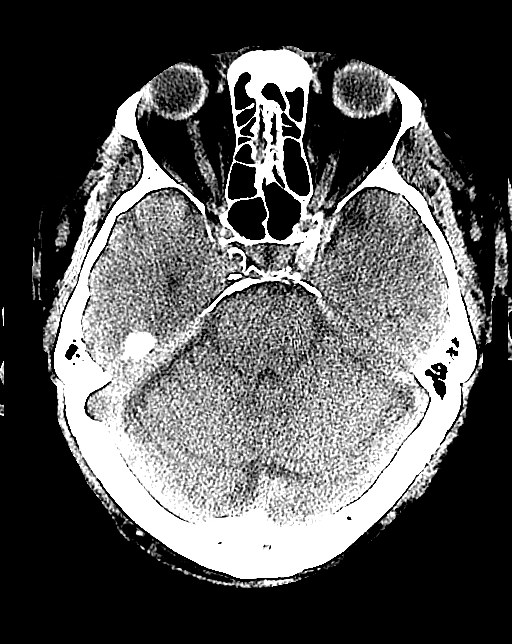
[im 37/87  brain]
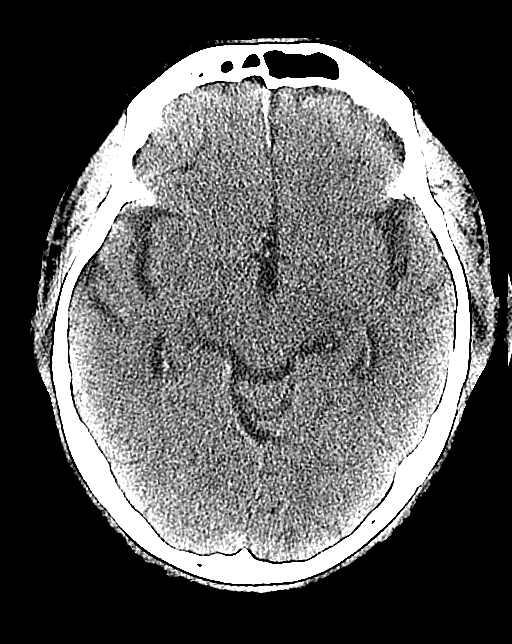
[im 50/87  brain]
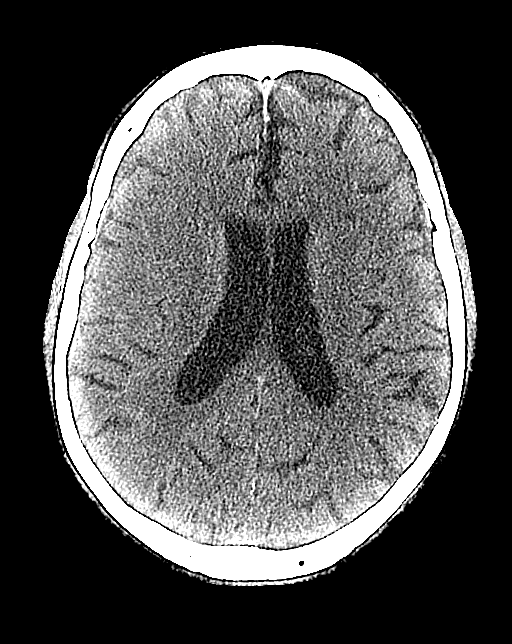
[im 50/87  bone]
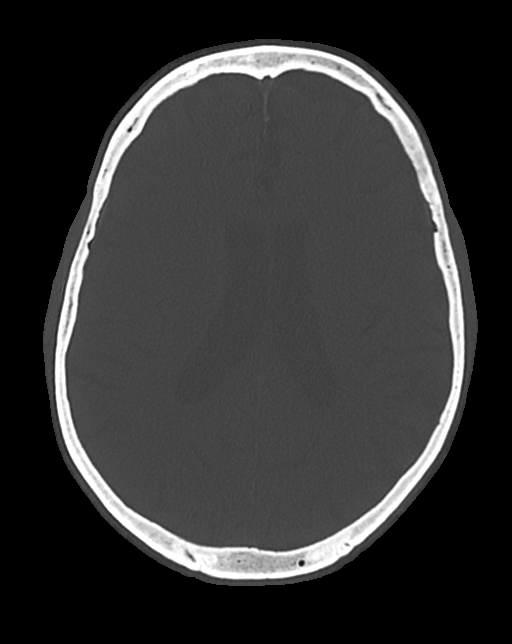
[im 58/87  brain]
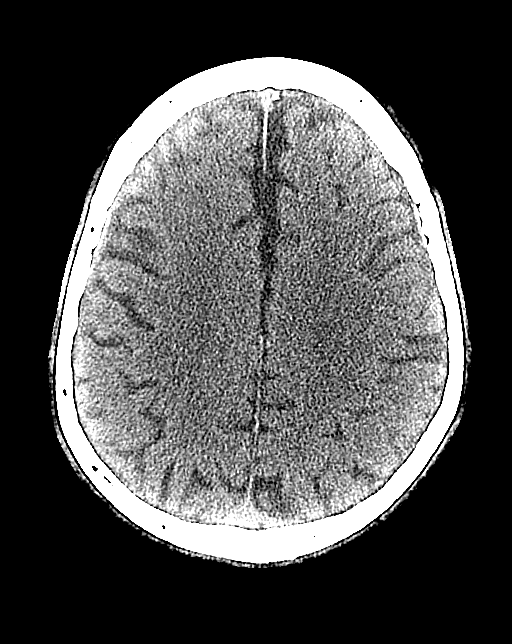
[im 70/87  brain]
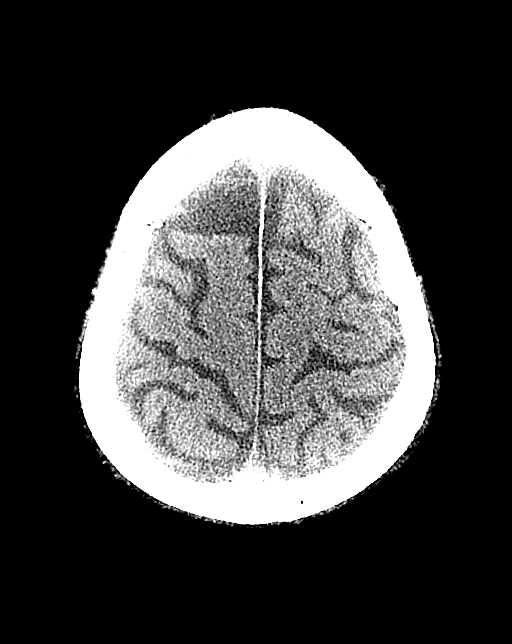
[im 78/87  brain]
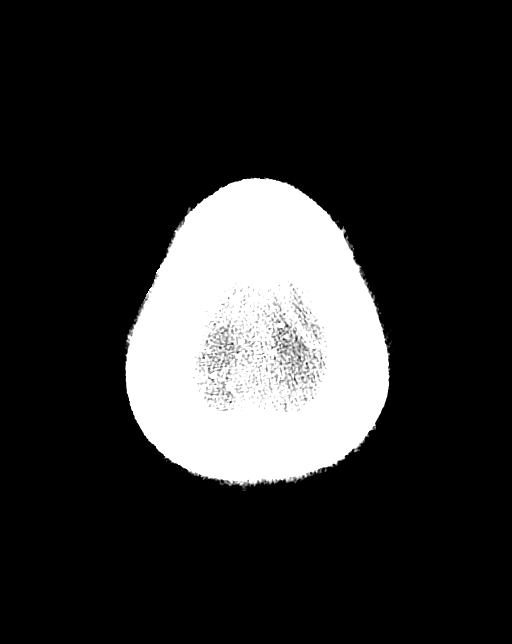

[14 of 47 positions shown; findings below may reference images not displayed]

DIAGNOSTIC STUDIES

EXAM

CT head without contrast

INDICATION

SDH
hx of SDH, no new falls or head injury, no surgery to head hx. ct/nm 6/0. kf

TECHNIQUE

All CT scans at this facility use dose modulation, iterative reconstruction, and/or weight based
dosing when appropriate to reduce radiation dose to as low as reasonably achievable.

Number of previous computed tomography exams in the last 12 months is 4.

Number of previous nuclear medicine myocardial perfusion studies in the last 12 months is 0 .

COMPARISONS

November 15, 2020

FINDINGS

Diffuse cortical atrophy is seen with underlying small vessel ischemic change similar prior exam.
No intracranial mass or hemorrhage is noted. Bony calvarium is normal. Calcification of the ca
vernous carotid arteries is evident.

IMPRESSION

Cortical atrophy and small vessel ischemic disease. No intracranial mass or hemorrhage is seen.

Tech Notes:

hx of SDH, no new falls or head injury, no surgery to head hx. ct/nm 6/0. kf

## 2023-05-01 LAB — BASIC METABOLIC PANEL
ANION GAP: 10
BLD UREA NITROGEN: 33 — ABNORMAL HIGH (ref 8.4–25.7)
CALCIUM: 9.7
CO2: 29
CREATININE: 1.9 — ABNORMAL HIGH (ref 0.72–1.25)
GLUCOSE,PANEL: 140 — ABNORMAL HIGH (ref 70–105)

## 2023-07-18 ENCOUNTER — Encounter: Admit: 2023-07-18 | Discharge: 2023-07-18 | Payer: MEDICARE

## 2023-07-18 DIAGNOSIS — Z951 Presence of aortocoronary bypass graft: Secondary | ICD-10-CM

## 2023-07-18 DIAGNOSIS — I2510 Atherosclerotic heart disease of native coronary artery without angina pectoris: Secondary | ICD-10-CM

## 2023-07-18 DIAGNOSIS — E782 Mixed hyperlipidemia: Secondary | ICD-10-CM

## 2023-07-18 DIAGNOSIS — I5033 Acute on chronic diastolic (congestive) heart failure: Secondary | ICD-10-CM

## 2023-07-18 DIAGNOSIS — I10 Essential (primary) hypertension: Secondary | ICD-10-CM

## 2023-07-25 ENCOUNTER — Encounter: Admit: 2023-07-25 | Discharge: 2023-07-25 | Payer: MEDICARE

## 2023-07-25 ENCOUNTER — Ambulatory Visit: Admit: 2023-07-25 | Discharge: 2023-07-25 | Payer: MEDICARE

## 2023-07-25 DIAGNOSIS — Z136 Encounter for screening for cardiovascular disorders: Secondary | ICD-10-CM

## 2023-07-25 DIAGNOSIS — I5033 Acute on chronic diastolic (congestive) heart failure: Secondary | ICD-10-CM

## 2023-07-25 DIAGNOSIS — I4821 Permanent atrial fibrillation: Secondary | ICD-10-CM

## 2023-07-25 MED ORDER — SIMVASTATIN 20 MG PO TAB
20 mg | ORAL_TABLET | Freq: Every evening | ORAL | 0 refills | Status: AC
Start: 2023-07-25 — End: ?

## 2023-07-25 MED ORDER — SPIRONOLACTONE 25 MG PO TAB
12.5 mg | ORAL_TABLET | Freq: Every day | ORAL | 3 refills | 90.00000 days | Status: AC
Start: 2023-07-25 — End: ?

## 2023-07-25 NOTE — Patient Instructions
 Thank you for visiting our office today.    We would like to make the following medication adjustments:      Take Simvastatin daily        Otherwise continue the same medications as you have been doing.          We will be pursuing the following tests after your appointment today:       Orders Placed This Encounter    ECG 12-LEAD    spironolactone (ALDACTONE) 25 mg tablet    simvastatin (ZOCOR) 20 mg tablet         We will plan to see you back in 6 months.  Please call us in the meantime with any questions or concerns.        Please allow 5-7 business days for our providers to review your results. All normal results will go to MyChart. If you do not have Mychart, it is strongly recommended to get this so you can easily view all your results. If you do not have mychart, we will attempt to call you once with normal lab and testing results. If we cannot reach you by phone with normal results, we will send you a letter.  If you have not heard the results of your testing after one week please give [place] a call.       Your Cardiovascular [facility] [place] Team Brett Canales, Pilar Jarvis, Shawna Orleans, and [place])  phone number is [phone].

## 2023-07-25 NOTE — Progress Notes
 Date of Service: 07/25/2023    Fernando [name] is a 89 y.o. male.       HPI   Fernando [name]  has been followed for severe aortic valve stenosis, permanent atrial fibrillation and heart failure with preserved ejection fraction.  He continues to do very well without any exacerbations of heart failure over the past year.  He has done extremely well on his current diuretic regimen. He is trying to watch his salt intake a lot better. He continues going to cardiac rehab on Tuesdays and Thursdays in [place] where he knows the physical therapists very well.  He walks on the treadmill for 20 minutes and uses the NuStep machine for 20 minutes.   Has a longstanding history of chronic visit venous insufficiency and chronic venous stasis ulcers.  From time to time he has developed venous stasis ulcers on his anterior tibial surface.  He currently has 2 small wounds on the anterior tibial surface of his left foreleg which is being treated by the local wound clinic with Aquacel Ag gauze.  He reports that they are healing gradually with time.  No wounds are reported on his right foreleg.  He reports that he tries to wear his support stockings and admits that the support stockings help control his edema.  He also readily admits that he takes his simvastatin infrequently.  He reports no adverse effects from simvastatin, just that he feels well and thinks that he can get by without it. Fernando [name] reports that his spironolactone was recently decreased to 12.5 mg daily to avoid orthostasis and falls.  Otherwise, the patient reports that he continues to feel well and has no complaints.  The patient reports no angina, palpitations, sensation of sustained forceful heart pounding, lightheadedness or syncope. The patient reports no myalgias, claudication, bleeding abnormalities, or strokelike symptoms.  His CHA2DS2-VASc score is 5 for age, diabetes, peripheral vascular disease and hypertension.  He has had prior surgical ligation of his left atrial appendage.   Historically,  Fernando [name] has been followed in the past for coronary artery disease, having undergone coronary bypass surgery in 2007.  He has also been followed for permanent atrial fibrillation and accepted a strategy of heart rate control in December 2017.   He fell in December 2021.  He then developed abnormalities in balance and evaluation revealed a subdural hematoma.  He did have a seizure.  He was taken off his aspirin and Eliquis and surgical intervention was not required. He has had prior surgical ligation of his left atrial appendage.  Fernando [name] also fell while cleaning his driveway drain on Nov 02 2020.  This required 3 staples in the back of his head.  Fernando [name] had severe aortic valve stenosis and underwent transcatheter aortic valve replacement on 01/12/2021 receiving a 26 mm S3 valve.   He was hospitalized from March 5 until August 18, 2021 for heart failure and also had COVID at the time.  He was diuresed 25 pounds. I noticed 1 emergency room visit on 11/18/2022 after a fall in his garage.  He struck his knees and his face but did not require hospitalization.        Vitals:    07/25/23 1118   BP: (!) 148/74   BP Source: Arm, Left Upper   Pulse: 73   SpO2: 98%   O2 Device: None (Room air)   PainSc: Zero   Weight: 104.9 kg (231 lb 3.2 oz)   Height: 188 cm (6' 2)  Body mass index is 29.68 kg/m?[name]     Past Medical History  Patient Active Problem List    Diagnosis Date Noted    Nonrheumatic tricuspid valve regurgitation 02/14/2022    Heart failure (HCC) 08/13/2021    S/p TAVR (transcatheter aortic valve replacement), bioprosthetic 01/12/2021     01/12/21 Dr. Helen Hashimoto and Dr. Riley Nearing      Hypokalemia 01/12/2021    Hypomagnesemia 01/12/2021    Nonrheumatic aortic valve stenosis 12/29/2020    Acute on chronic combined systolic and diastolic congestive heart failure, NYHA class 2 (HCC) 12/29/2020    Stage 3a chronic kidney disease (HCC) 12/29/2020    Venous stasis of lower extremity 12/29/2020 Pulmonary HTN (HCC) 11/24/2020    Chronic diastolic heart failure (HCC) 11/24/2020    Subdural hematoma (HCC) 08/15/2020    Permanent atrial fibrillation (HCC) 06/29/2020    Osteoarthrosis, unspecified whether generalized or localized, lower leg 03/11/2012    Primary localized osteoarthrosis, lower leg 12/18/2011    Pain in joint, lower leg 12/18/2011    S/P knee replacement 12/18/2011    Knee pain 10/25/2011    Osteoarthritis of knee 10/25/2011    S/P CABG (coronary artery bypass graft) 09/15/2010    CAD (coronary artery disease) 11/11/2008     status post CABG on 06/18/2005 (patient received a LIMA to      LAD, reverse SVG to OM branch, reverse SVG to mid-RCA and reverse SVG to second      diagonal).  Patient is angina free and he is a functional Class I.  History of chest pain and admission in [facility] ([place] in 11/2007.  Exercise myocardial perfusion imaging study dated 11/13/2007, unremarkable for      ischemia.      Obesity 11/11/2008    Hyperlipidemia 11/11/2008    Hypertension, essential 11/11/2008         Review of Systems   Constitutional: Negative.   HENT: Negative.     Eyes: Negative.    Cardiovascular: Negative.    Respiratory: Negative.     Endocrine: Negative.    Hematologic/Lymphatic: Negative.    Skin: Negative.    Musculoskeletal: Negative.    Gastrointestinal: Negative.    Genitourinary: Negative.    Neurological: Negative.    Psychiatric/Behavioral: Negative.     Allergic/Immunologic: Negative.        Physical Exam  GENERAL: The patient is well developed, well nourished, resting comfortably and in no distress.   HEENT: No abnormalities of the visible oro-nasopharynx, conjunctiva or sclera are noted.  NECK: There is no jugular venous distension. Carotids are palpable and without bruits. There is no thyroid enlargement.  Chest: Lung fields are clear to auscultation. There are no wheezes or crackles.  CV: There is a regular rhythm. The first and second heart sounds are normal.  A grade 2/6 systolic ejection murmur is heard along the right upper sternal border.  There are no diastolic murmurs, gallops or rubs.  ABD: The abdomen is soft and supple with normal bowel sounds. There is no hepatosplenomegaly, ascites, tenderness, masses or bruits.  Neuro: There are no focal motor defects. Ambulation is normal. Cognitive function appears normal.  Ext: There is trace to 1+  bilateral lower extremity edema  evidence of deep vein thrombosis. Peripheral pulses are satisfactory.    SKIN: There are no rashes and no cellulitis.  Chronic venous stasis dermatitis bilaterally on pretibial surfaces with 2 wounds on his left foreleg which are wrapped with Aquacel Ag gauze.  et.  PSYCH: The  patient is calm, rationale and oriented.    Cardiovascular Studies  A twelve-lead ECG obtained on 07/25/2023 reveals atrial fibrillation with heart rate of 67 bpm.  Incomplete right bundle branch block is noted.    Echo Doppler 02/14/2022:  Interpretation Summary  Normal sized left ventricle.  Moderate concentric LV hypertrophy with severe basal septal hypertrophy.  Normal left ventricular systolic function with estimated ejection fraction of 57% by biplane Simpson's method. No regional wall motion abnormalities.   Grade 2 diastolic dysfunction with elevated estimated left atrial pressure.  Severely dilated right ventricle.  Mildly reduced ventricular systolic function.  Moderately dilated left atrium.  Severely dilated right atrium.  Dilated IVC with normal compressibility.  26 mm S3 bioprosthetic valve present in aortic position with no evidence of stenosis or regurgitation.  Peak velocity of 2.4 m/s and mean gradient of 13 mmHg.  Mild to moderate mitral regurgitation.  Moderate to likely severe tricuspid regurgitation with hepatic vein flow reversal seen.  Moderate to severe pulmonic regurgitation.    Moderate to severe pulmonary hypertension with estimated PA systolic pressure of 69 mmHg  No pericardial effusion.     Please see full report for additional details.    Prior study for comparison: 08/15/2021.  No major changes compared to prior echo report.    Cardiovascular Health Factors  Vitals BP Readings from Last 3 Encounters:   07/25/23 (!) 148/74   01/24/23 129/70   07/24/22 138/62     Wt Readings from Last 3 Encounters:   07/25/23 104.9 kg (231 lb 3.2 oz)   01/24/23 104.2 kg (229 lb 12.8 oz)   07/24/22 113.4 kg (250 lb 1.6 oz)     BMI Readings from Last 3 Encounters:   07/25/23 29.68 kg/m?   01/24/23 29.50 kg/m?   07/24/22 32.11 kg/m?      Smoking Social History     Tobacco Use   Smoking Status Never   Smokeless Tobacco Never      Lipid Profile Cholesterol   Date Value Ref Range Status   10/31/2022 151  Final     HDL   Date Value Ref Range Status   10/31/2022 36 (L) >=40 Final     LDL   Date Value Ref Range Status   10/31/2022 99  Final     Triglycerides   Date Value Ref Range Status   10/31/2022 83  Final      Blood Sugar Hemoglobin A1C   Date Value Ref Range Status   08/14/2021 7.9 (H) 4.0 - 5.7 % Final     Comment:     The ADA recommends that most patients with type 1 and type 2 diabetes maintain   an A1c level <7%.       Glucose   Date Value Ref Range Status   05/01/2023 140 (H) 70 - 105 Final   10/31/2022 146 (H) 70 - 105 Final   02/14/2022 126 (H) 70 - 100 MG/DL Final     Glucose, POC   Date Value Ref Range Status   08/18/2021 193 (H) 70 - 100 MG/DL Final   16/03/9603 540 (H) 70 - 100 MG/DL Final   98/04/9146 829 (H) 70 - 100 MG/DL Final          Problems Addressed Today  Encounter Diagnoses   Name Primary?    Screening for heart disease Yes       Assessment and Plan     Fernando [name] appears to be doing very well, especially considering  his age.  His cognitive and physical capacities have remained stable.  I recommended that he consult with nephrology at [facility] for his chronic renal insufficiency which he politely declined.  He reports no angina and his congestive heart failure appears extremely well compensated.  He appears to be restricting his salt and his diuretic regimen looks very appropriate.  I have asked him to be compliant taking his simvastatin as well as his other medications.  Fall precautions were strongly reinforced.  I have asked him to return for follow-up in 6 months time. The total time spent during this interview and exam with preparation and chart review was 30 minutes.          Current Medications (including today's revisions)   acetaminophen (TYLENOL) 325 mg tablet Take two tablets by mouth every 6 hours as needed.    aspirin EC (ASPIR-LOW) 81 mg tablet Take one tablet by mouth daily. Take with food.    cephalexin (KEFLEX) 500 mg capsule Take one capsule by mouth twice daily. FOR 10 DAYS    fluticasone propionate (FLONASE) 50 mcg/actuation nasal spray, suspension Apply two sprays to each nostril as directed as Needed.    furosemide (LASIX) 40 mg tablet TAKE 1 TABLET EVERY MORNING    JANUVIA 50 mg tablet Take one tablet by mouth daily.    losartan (COZAAR) 25 mg tablet Take one tablet by mouth daily. (Patient taking differently: Take four tablets by mouth daily.)    metOLazone (ZAROXOLYN) 2.5 mg tablet Take one tablet by mouth. Takes MWF    metoprolol succinate XL (TOPROL XL) 25 mg extended release tablet Take one tablet by mouth daily.    nitroglycerin (NITROSTAT) 0.4 mg tablet Place 1 Tab under tongue every 5 minutes as needed for Chest Pain.    pioglitazone (ACTOS) 30 mg tablet Take one tablet by mouth daily.    potassium chloride (KLOR-CON 10) 10 mEq tablet Take one tablet by mouth daily with food.    simvastatin (ZOCOR) 20 mg tablet TAKE ONE TABLET BY MOUTH AT BEDTIME (Patient taking differently: Take one tablet by mouth at bedtime daily.)    spironolactone (ALDACTONE) 25 mg tablet TAKE 1/2 TABLET TWICE DAILY WITH FOOD

## 2023-09-24 ENCOUNTER — Encounter: Admit: 2023-09-24 | Discharge: 2023-09-24 | Payer: MEDICARE

## 2023-09-24 MED ORDER — SIMVASTATIN 20 MG PO TAB
20 mg | ORAL_TABLET | Freq: Every evening | ORAL | 11 refills | 90.00000 days | Status: AC
Start: 2023-09-24 — End: ?

## 2024-02-22 ENCOUNTER — Encounter: Admit: 2024-02-22 | Discharge: 2024-02-22 | Payer: MEDICARE

## 2024-02-27 ENCOUNTER — Encounter: Admit: 2024-02-27 | Discharge: 2024-02-27 | Payer: MEDICARE

## 2024-02-27 ENCOUNTER — Ambulatory Visit: Admit: 2024-02-27 | Discharge: 2024-02-27 | Payer: MEDICARE

## 2024-02-27 VITALS — BP 156/82 | HR 62 | Ht 74.0 in | Wt 222.2 lb

## 2024-02-27 DIAGNOSIS — I10 Essential (primary) hypertension: Secondary | ICD-10-CM

## 2024-02-27 DIAGNOSIS — Z953 Presence of xenogenic heart valve: Secondary | ICD-10-CM

## 2024-02-27 DIAGNOSIS — I272 Pulmonary hypertension, unspecified: Secondary | ICD-10-CM

## 2024-02-27 DIAGNOSIS — Z951 Presence of aortocoronary bypass graft: Secondary | ICD-10-CM

## 2024-02-27 DIAGNOSIS — I2510 Atherosclerotic heart disease of native coronary artery without angina pectoris: Principal | ICD-10-CM

## 2024-02-27 DIAGNOSIS — E782 Mixed hyperlipidemia: Secondary | ICD-10-CM

## 2024-02-27 NOTE — Progress Notes
 Date of Service: 02/27/2024    Fernando [name] is a 89 y.o. male.       HPI   Mr. Fernando [name]  has been followed for severe aortic valve stenosis, permanent atrial fibrillation and heart failure with preserved ejection fraction.  He continues to do very well without any exacerbations of heart failure over the past year.  However, I get the feeling that sometimes he takes his medications irregularly, either because he forgets or because he associates his diuretic medications with urinary frequency/urgency.  He is no longer going to cardiac rehab but reports that he gets a lot of exercise picking apples.   Has a longstanding history of chronic visit venous insufficiency and chronic venous stasis ulcers.  From time to time he has developed venous stasis ulcers on his anterior tibial surface.  However, all of his wounds of healed with the use of Aquacel Ag gauze.  He reports that he tries to wear his support stockings and admits that the support stockings help control his edema.  He also readily admits that he takes his simvastatin  infrequently.  He reports no adverse effects from simvastatin , just that he feels well and thinks that he can get by without it.  Otherwise, the patient reports that he continues to feel well and has no complaints.  The patient reports no angina, palpitations, sensation of sustained forceful heart pounding, lightheadedness or syncope. The patient reports no myalgias, claudication, bleeding abnormalities, or strokelike symptoms.  His CHA2DS2-VASc score is 5 for age, diabetes, peripheral vascular disease and hypertension.  He has had prior surgical ligation of his left atrial appendage.   Historically,  Mr. Fernando [name] has been followed in the past for coronary artery disease, having undergone coronary bypass surgery in 2007.  He has also been followed for permanent atrial fibrillation and accepted a strategy of heart rate control in December 2017.   He fell in December 2021.  He then developed abnormalities in balance and evaluation revealed a subdural hematoma.  He did have a seizure.  He was taken off his aspirin  and Eliquis  and surgical intervention was not required. He has had prior surgical ligation of his left atrial appendage.  Mr. Fernando [name] also fell while cleaning his driveway drain on Nov 02 2020.  This required 3 staples in the back of his head.  Mr. Fernando [name] had severe aortic valve stenosis and underwent transcatheter aortic valve replacement on 01/12/2021 receiving a 26 mm S3 valve.   He was hospitalized from March 5 until August 18, 2021 for heart failure and also had COVID at the time.  He was diuresed 25 pounds. I noticed 1 emergency room visit on 11/18/2022 after a fall in his garage.  He struck his knees and his face but did not require hospitalization.        Vitals:    02/27/24 1025   BP: (!) 156/82   BP Source: Arm, Left Upper   Pulse: 62   SpO2: 99%   O2 Device: None (Room air)   PainSc: Zero   Weight: 100.8 kg (222 lb 3.2 oz)   Height: 188 cm (6' 2)     Body mass index is 28.53 kg/m?SABRA     Past Medical History  Patient Active Problem List    Diagnosis Date Noted    Nonrheumatic tricuspid valve regurgitation 02/14/2022    Heart failure (CMS-HCC) 08/13/2021    S/p TAVR (transcatheter aortic valve replacement), bioprosthetic 01/12/2021     01/12/21 Dr. Zorn and Dr. Gunasekaran  Hypokalemia 01/12/2021    Hypomagnesemia 01/12/2021    Nonrheumatic aortic valve stenosis 12/29/2020    Acute on chronic combined systolic and diastolic congestive heart failure, NYHA class 2 (CMS-HCC) 12/29/2020    Stage 3a chronic kidney disease (CMS-HCC) 12/29/2020    Venous stasis of lower extremity 12/29/2020    Pulmonary HTN (CMS-HCC) 11/24/2020    Chronic diastolic heart failure (CMS-HCC) 11/24/2020    Subdural hematoma (CMS-HCC) 08/15/2020    Permanent atrial fibrillation (CMS-HCC) 06/29/2020    Osteoarthrosis, unspecified whether generalized or localized, lower leg 03/11/2012    Primary localized osteoarthrosis, lower leg 12/18/2011    Pain in joint, lower leg 12/18/2011    S/P knee replacement 12/18/2011    Knee pain 10/25/2011    Osteoarthritis of knee 10/25/2011    S/P CABG (coronary artery bypass graft) 09/15/2010    CAD (coronary artery disease) 11/11/2008     status post CABG on 06/18/2005 (patient received a LIMA to      LAD, reverse SVG to OM branch, reverse SVG to mid-RCA and reverse SVG to second      diagonal).  Patient is angina free and he is a functional Class I.  History of chest pain and admission in [facility] in 11/2007.  Exercise myocardial perfusion imaging study dated 11/13/2007, unremarkable for      ischemia.      Obesity 11/11/2008    Hyperlipidemia 11/11/2008    Hypertension, essential 11/11/2008         Review of Systems   Constitutional: Negative.   HENT: Negative.     Eyes: Negative.    Cardiovascular: Negative.    Respiratory: Negative.     Endocrine: Negative.    Hematologic/Lymphatic: Negative.    Skin: Negative.    Musculoskeletal: Negative.    Gastrointestinal: Negative.    Genitourinary: Negative.    Neurological: Negative.    Psychiatric/Behavioral: Negative.     Allergic/Immunologic: Negative.        Physical Exam  GENERAL: The patient is well developed, well nourished, resting comfortably and in no distress.   HEENT: No abnormalities of the visible oro-nasopharynx, conjunctiva or sclera are noted.  NECK: There is no jugular venous distension. Carotids are palpable and without bruits. There is no thyroid enlargement.  Chest: Lung fields are clear to auscultation. There are no wheezes or crackles.  CV: There is a regular rhythm. The first and second heart sounds are normal.  A grade 2/6 systolic ejection murmur is heard along the right upper sternal border.  There are no diastolic murmurs, gallops or rubs.  ABD: The abdomen is soft and supple with normal bowel sounds. There is no hepatosplenomegaly, ascites, tenderness, masses or bruits.  Neuro: There are no focal motor defects. Ambulation is normal. Cognitive function appears normal.  Ext: There is trace to 1+  bilateral lower extremity edema  evidence of deep vein thrombosis. Peripheral pulses are satisfactory.    SKIN: There are no rashes and no cellulitis.  Chronic venous stasis dermatitis bilaterally on pretibial surfaces but all of his ulcers have healed nicely.    PSYCH: The patient is calm, rationale and oriented.    Cardiovascular Studies  A twelve-lead ECG obtained on 07/25/2023 reveals atrial fibrillation with heart rate of 67 bpm.  Incomplete right bundle branch block is noted.     Echo Doppler 02/14/2022:  Interpretation Summary  Normal sized left ventricle.  Moderate concentric LV hypertrophy with severe basal septal hypertrophy.  Normal left ventricular systolic function with estimated  ejection fraction of 57% by biplane Simpson's method. No regional wall motion abnormalities.   Grade 2 diastolic dysfunction with elevated estimated left atrial pressure.  Severely dilated right ventricle.  Mildly reduced ventricular systolic function.  Moderately dilated left atrium.  Severely dilated right atrium.  Dilated IVC with normal compressibility.  26 mm S3 bioprosthetic valve present in aortic position with no evidence of stenosis or regurgitation.  Peak velocity of 2.4 m/s and mean gradient of 13 mmHg.  Mild to moderate mitral regurgitation.  Moderate to likely severe tricuspid regurgitation with hepatic vein flow reversal seen.  Moderate to severe pulmonic regurgitation.    Moderate to severe pulmonary hypertension with estimated PA systolic pressure of 69 mmHg  No pericardial effusion.     Please see full report for additional details.    Prior study for comparison: 08/15/2021.  No major changes compared to prior echo report.    Cardiovascular Health Factors  Vitals BP Readings from Last 3 Encounters:   02/27/24 (!) 156/82   07/25/23 (!) 148/74   01/24/23 129/70     Wt Readings from Last 3 Encounters:   02/27/24 100.8 kg (222 lb 3.2 oz)   07/25/23 104.9 kg (231 lb 3.2 oz)   01/24/23 104.2 kg (229 lb 12.8 oz)     BMI Readings from Last 3 Encounters:   02/27/24 28.53 kg/m?   07/25/23 29.68 kg/m?   01/24/23 29.50 kg/m?      Smoking Tobacco Use History[1]   Lipid Profile Cholesterol   Date Value Ref Range Status   10/31/2022 151  Final     HDL   Date Value Ref Range Status   10/31/2022 36 (L) >=40 Final     LDL   Date Value Ref Range Status   10/31/2022 99  Final     Triglycerides   Date Value Ref Range Status   10/31/2022 83  Final      Blood Sugar Hemoglobin A1C   Date Value Ref Range Status   08/14/2021 7.9 (H) 4.0 - 5.7 % Final     Comment:     The ADA recommends that most patients with type 1 and type 2 diabetes maintain   an A1c level <7%.       Glucose   Date Value Ref Range Status   05/01/2023 140 (H) 70 - 105 Final   10/31/2022 146 (H) 70 - 105 Final   02/14/2022 126 (H) 70 - 100 MG/DL Final     Glucose, POC   Date Value Ref Range Status   08/18/2021 193 (H) 70 - 100 MG/DL Final   96/89/7976 867 (H) 70 - 100 MG/DL Final   96/90/7976 772 (H) 70 - 100 MG/DL Final          Problems Addressed Today  Aortic valve stenosis.  Permanent atrial fibrillation.  Hypertension.  Coronary artery disease.    Assessment and Plan   Mr. Artley appears to be doing very well, especially considering his age.  His cognitive and physical capacities have remained stable.  I recommended that he consult with nephrology at [facility] for his chronic renal insufficiency which he politely declined.  He reports no angina and his congestive heart failure appears extremely well compensated.  He appears to be restricting his salt and his diuretic regimen looks very appropriate.  I have asked him to be compliant taking his simvastatin  as well as his other medications.  I also reminded him to take his other cardiac medications as prescribed rather  than intermittently.  I have asked the patient to keep a log book of his BP readings and to report BP readings exceeding 130/80 mm Hg. Fall precautions were strongly reinforced.  Since he has had prior transcatheter aortic valve replacement I have asked him to obtain a repeat echo Doppler study.  I have also asked him to check his Chem-7, fasting lipid profile and ALT.  I have asked him to return for follow-up in 6 months time. The total time spent during this interview and exam with preparation and chart review was 30 minutes.            Current Medications (including today's revisions)   acetaminophen  (TYLENOL ) 325 mg tablet Take two tablets by mouth every 6 hours as needed.    allopurinoL (ZYLOPRIM) 100 mg tablet Take one tablet by mouth daily.    aspirin  EC (ASPIR-LOW) 81 mg tablet Take one tablet by mouth daily. Take with food.    donepeziL (ARICEPT) 5 mg tablet Take one tablet by mouth daily.    fluticasone propionate (FLONASE) 50 mcg/actuation nasal spray, suspension Apply two sprays to each nostril as directed as Needed.    furosemide  (LASIX ) 40 mg tablet TAKE 1 TABLET EVERY MORNING    JANUVIA 50 mg tablet Take one tablet by mouth daily.    losartan  (COZAAR ) 25 mg tablet Take one tablet by mouth daily.    metOLazone  (ZAROXOLYN ) 2.5 mg tablet Take one tablet by mouth three times weekly. Takes MWF    metoprolol  succinate XL (TOPROL  XL) 25 mg extended release tablet Take one tablet by mouth daily.    nitroglycerin  (NITROSTAT ) 0.4 mg tablet Place 1 Tab under tongue every 5 minutes as needed for Chest Pain.    simvastatin  (ZOCOR ) 20 mg tablet TAKE 1 TABLET BY MOUTH EVERY NIGHT AT BEDTIME    spironolactone  (ALDACTONE ) 25 mg tablet Take one-half tablet by mouth daily. Take with food.                 [1]   Social History  Tobacco Use   Smoking Status Never   Smokeless Tobacco Never

## 2024-02-27 NOTE — Patient Instructions
 Thank you for visiting our office today.    We would like to make the following medication adjustments:  NONE       Otherwise continue the same medications as you have been doing.          We will be pursuing the following tests after your appointment today:       Orders Placed This Encounter    Lipid Profile    Basic Metabolic Panel    Alt (Sgpt)    2D + DOPPLER ECHO     FASTING labs (10-12hrs prior)    We will plan to see you back in 6 months.  Please call us  in the meantime with any questions or concerns.        Please allow 5-7 business days for our providers to review your results. All normal results will go to MyChart. If you do not have Mychart, it is strongly recommended to get this so you can easily view all your results. If you do not have mychart, we will attempt to call you once with normal lab and testing results. If we cannot reach you by phone with normal results, we will send you a letter.  If you have not heard the results of your testing after one week please give us  a call.       Your [facility] [place] [facility] [place], [place], [place], and [place])  phone number is [phone].

## 2024-04-06 ENCOUNTER — Encounter: Admit: 2024-04-06 | Discharge: 2024-04-06 | Payer: MEDICARE

## 2024-04-06 ENCOUNTER — Ambulatory Visit: Admit: 2024-04-06 | Discharge: 2024-04-06 | Payer: MEDICARE

## 2024-04-06 NOTE — Telephone Encounter [36]
 Results and recommendations discussed with patient's daughter, Luke. She states that patient has been keep ing track of his BP readings and showed them to Dr. Naomia who made medication changes. Luke states that she does not have the BP log or medication changes available to her to report at this time.     Patient will send a [facility] with BP readings and recent medication changes.

## 2024-04-06 NOTE — Telephone Encounter [36]
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  However his recorded blood pressure is elevated.  Please ask him to record his blood pressure in the   late morning or early afternoon twice a week and report his readings in approximately a month.  Thanks.  [facility]  ----- Message -----  From: Alexander Elspeth NOVAK, MD  Sent: 04/06/2024  12:46 PM CDT  To: Elspeth NOVAK Alexander, MD

## 2024-04-14 ENCOUNTER — Encounter: Admit: 2024-04-14 | Discharge: 2024-04-14 | Payer: MEDICARE
